# Patient Record
Sex: Female | Born: 1975 | Race: Black or African American | Hispanic: No | Marital: Single | Smoking: Never smoker
Health system: Southern US, Community
[De-identification: ages and names within clinical notes are randomized; demographics above are authoritative.]

## PROBLEM LIST (undated history)

## (undated) DIAGNOSIS — G8929 Other chronic pain: Secondary | ICD-10-CM

## (undated) DIAGNOSIS — I619 Nontraumatic intracerebral hemorrhage, unspecified: Secondary | ICD-10-CM

## (undated) DIAGNOSIS — G5600 Carpal tunnel syndrome, unspecified upper limb: Secondary | ICD-10-CM

## (undated) DIAGNOSIS — R519 Headache, unspecified: Secondary | ICD-10-CM

## (undated) DIAGNOSIS — F329 Major depressive disorder, single episode, unspecified: Secondary | ICD-10-CM

## (undated) DIAGNOSIS — F32A Depression, unspecified: Secondary | ICD-10-CM

## (undated) DIAGNOSIS — M549 Dorsalgia, unspecified: Secondary | ICD-10-CM

## (undated) DIAGNOSIS — J45909 Unspecified asthma, uncomplicated: Secondary | ICD-10-CM

## (undated) DIAGNOSIS — E119 Type 2 diabetes mellitus without complications: Secondary | ICD-10-CM

## (undated) DIAGNOSIS — R51 Headache: Secondary | ICD-10-CM

## (undated) DIAGNOSIS — F1110 Opioid abuse, uncomplicated: Secondary | ICD-10-CM

## (undated) HISTORY — PX: TUBAL LIGATION: SHX77

## (undated) HISTORY — PX: TONSILLECTOMY: SUR1361

---

## 1998-07-14 ENCOUNTER — Encounter: Payer: Self-pay | Admitting: Emergency Medicine

## 1998-07-14 ENCOUNTER — Emergency Department (HOSPITAL_COMMUNITY): Admission: EM | Admit: 1998-07-14 | Discharge: 1998-07-14 | Payer: Self-pay | Admitting: Emergency Medicine

## 1999-02-07 ENCOUNTER — Emergency Department (HOSPITAL_COMMUNITY): Admission: EM | Admit: 1999-02-07 | Discharge: 1999-02-07 | Payer: Self-pay | Admitting: *Deleted

## 1999-04-13 ENCOUNTER — Encounter: Payer: Self-pay | Admitting: Emergency Medicine

## 1999-04-13 ENCOUNTER — Emergency Department (HOSPITAL_COMMUNITY): Admission: EM | Admit: 1999-04-13 | Discharge: 1999-04-13 | Payer: Self-pay | Admitting: Emergency Medicine

## 2000-01-15 ENCOUNTER — Other Ambulatory Visit: Admission: RE | Admit: 2000-01-15 | Discharge: 2000-01-15 | Payer: Self-pay | Admitting: Internal Medicine

## 2002-02-10 ENCOUNTER — Emergency Department (HOSPITAL_COMMUNITY): Admission: EM | Admit: 2002-02-10 | Discharge: 2002-02-10 | Payer: Self-pay | Admitting: *Deleted

## 2002-03-29 ENCOUNTER — Emergency Department (HOSPITAL_COMMUNITY): Admission: EM | Admit: 2002-03-29 | Discharge: 2002-03-29 | Payer: Self-pay | Admitting: Emergency Medicine

## 2002-05-06 ENCOUNTER — Emergency Department (HOSPITAL_COMMUNITY): Admission: EM | Admit: 2002-05-06 | Discharge: 2002-05-06 | Payer: Self-pay | Admitting: Emergency Medicine

## 2002-06-16 ENCOUNTER — Emergency Department (HOSPITAL_COMMUNITY): Admission: EM | Admit: 2002-06-16 | Discharge: 2002-06-16 | Payer: Self-pay | Admitting: Emergency Medicine

## 2002-06-22 ENCOUNTER — Emergency Department (HOSPITAL_COMMUNITY): Admission: EM | Admit: 2002-06-22 | Discharge: 2002-06-22 | Payer: Self-pay | Admitting: Emergency Medicine

## 2002-09-18 ENCOUNTER — Encounter: Admission: RE | Admit: 2002-09-18 | Discharge: 2002-09-18 | Payer: Self-pay | Admitting: Sports Medicine

## 2002-12-24 ENCOUNTER — Emergency Department (HOSPITAL_COMMUNITY): Admission: EM | Admit: 2002-12-24 | Discharge: 2002-12-24 | Payer: Self-pay | Admitting: Emergency Medicine

## 2002-12-27 ENCOUNTER — Emergency Department (HOSPITAL_COMMUNITY): Admission: EM | Admit: 2002-12-27 | Discharge: 2002-12-27 | Payer: Self-pay | Admitting: Emergency Medicine

## 2003-02-08 ENCOUNTER — Other Ambulatory Visit: Admission: RE | Admit: 2003-02-08 | Discharge: 2003-02-08 | Payer: Self-pay | Admitting: *Deleted

## 2003-08-07 ENCOUNTER — Emergency Department (HOSPITAL_COMMUNITY): Admission: EM | Admit: 2003-08-07 | Discharge: 2003-08-07 | Payer: Self-pay

## 2003-11-06 ENCOUNTER — Emergency Department (HOSPITAL_COMMUNITY): Admission: EM | Admit: 2003-11-06 | Discharge: 2003-11-06 | Payer: Self-pay | Admitting: Emergency Medicine

## 2004-07-08 ENCOUNTER — Ambulatory Visit (HOSPITAL_COMMUNITY): Admission: RE | Admit: 2004-07-08 | Discharge: 2004-07-08 | Payer: Self-pay | Admitting: Family Medicine

## 2004-07-08 ENCOUNTER — Emergency Department (HOSPITAL_COMMUNITY): Admission: EM | Admit: 2004-07-08 | Discharge: 2004-07-08 | Payer: Self-pay | Admitting: Family Medicine

## 2005-09-24 ENCOUNTER — Emergency Department (HOSPITAL_COMMUNITY): Admission: EM | Admit: 2005-09-24 | Discharge: 2005-09-24 | Payer: Self-pay | Admitting: Family Medicine

## 2008-09-18 ENCOUNTER — Encounter (INDEPENDENT_AMBULATORY_CARE_PROVIDER_SITE_OTHER): Payer: Self-pay | Admitting: *Deleted

## 2008-10-25 ENCOUNTER — Ambulatory Visit: Payer: Self-pay | Admitting: Family Medicine

## 2008-10-25 DIAGNOSIS — F3289 Other specified depressive episodes: Secondary | ICD-10-CM | POA: Insufficient documentation

## 2008-10-25 DIAGNOSIS — G5600 Carpal tunnel syndrome, unspecified upper limb: Secondary | ICD-10-CM | POA: Insufficient documentation

## 2008-10-25 DIAGNOSIS — R51 Headache: Secondary | ICD-10-CM | POA: Insufficient documentation

## 2008-10-25 DIAGNOSIS — R930 Abnormal findings on diagnostic imaging of skull and head, not elsewhere classified: Secondary | ICD-10-CM | POA: Insufficient documentation

## 2008-10-25 DIAGNOSIS — R519 Headache, unspecified: Secondary | ICD-10-CM | POA: Insufficient documentation

## 2008-10-25 DIAGNOSIS — F329 Major depressive disorder, single episode, unspecified: Secondary | ICD-10-CM

## 2008-10-25 DIAGNOSIS — E669 Obesity, unspecified: Secondary | ICD-10-CM | POA: Insufficient documentation

## 2008-10-25 LAB — CONVERTED CEMR LAB
CO2: 23 meq/L (ref 19–32)
Calcium: 9.2 mg/dL (ref 8.4–10.5)
Creatinine, Ser: 0.8 mg/dL (ref 0.40–1.20)
GC Probe Amp, Urine: NEGATIVE
Hgb A1c MFr Bld: 5.8 %
TSH: 2.26 microintl units/mL (ref 0.350–4.500)

## 2008-10-28 ENCOUNTER — Encounter: Payer: Self-pay | Admitting: Family Medicine

## 2009-07-19 ENCOUNTER — Emergency Department (HOSPITAL_COMMUNITY): Admission: EM | Admit: 2009-07-19 | Discharge: 2009-07-20 | Payer: Self-pay | Admitting: Emergency Medicine

## 2010-01-04 ENCOUNTER — Emergency Department (HOSPITAL_COMMUNITY): Admission: EM | Admit: 2010-01-04 | Discharge: 2010-01-04 | Payer: Self-pay | Admitting: Emergency Medicine

## 2010-08-28 LAB — URINALYSIS, ROUTINE W REFLEX MICROSCOPIC
Bilirubin Urine: NEGATIVE
Ketones, ur: NEGATIVE mg/dL
Nitrite: NEGATIVE
Specific Gravity, Urine: 1.015 (ref 1.005–1.030)
Urobilinogen, UA: 0.2 mg/dL (ref 0.0–1.0)

## 2010-08-28 LAB — BASIC METABOLIC PANEL
BUN: 8 mg/dL (ref 6–23)
CO2: 25 mEq/L (ref 19–32)
Calcium: 9.2 mg/dL (ref 8.4–10.5)
Creatinine, Ser: 0.87 mg/dL (ref 0.4–1.2)
Glucose, Bld: 108 mg/dL — ABNORMAL HIGH (ref 70–99)

## 2010-08-28 LAB — DIFFERENTIAL
Basophils Absolute: 0.1 10*3/uL (ref 0.0–0.1)
Basophils Relative: 1 % (ref 0–1)
Monocytes Absolute: 0.5 10*3/uL (ref 0.1–1.0)
Neutro Abs: 6.6 10*3/uL (ref 1.7–7.7)
Neutrophils Relative %: 69 % (ref 43–77)

## 2010-08-28 LAB — CBC
MCHC: 33.8 g/dL (ref 30.0–36.0)
Platelets: 353 10*3/uL (ref 150–400)
RDW: 13.8 % (ref 11.5–15.5)

## 2010-08-28 LAB — RAPID URINE DRUG SCREEN, HOSP PERFORMED
Benzodiazepines: NOT DETECTED
Cocaine: POSITIVE — AB
Tetrahydrocannabinol: NOT DETECTED

## 2010-08-28 LAB — PREGNANCY, URINE: Preg Test, Ur: NEGATIVE

## 2012-02-03 ENCOUNTER — Encounter (HOSPITAL_COMMUNITY): Payer: Self-pay | Admitting: *Deleted

## 2012-02-03 ENCOUNTER — Emergency Department (HOSPITAL_COMMUNITY)
Admission: EM | Admit: 2012-02-03 | Discharge: 2012-02-04 | Disposition: A | Discharge status: Home / Self Care | Payer: Self-pay | Attending: Emergency Medicine | Admitting: Emergency Medicine

## 2012-02-03 DIAGNOSIS — R197 Diarrhea, unspecified: Secondary | ICD-10-CM | POA: Insufficient documentation

## 2012-02-03 DIAGNOSIS — N926 Irregular menstruation, unspecified: Secondary | ICD-10-CM

## 2012-02-03 DIAGNOSIS — R112 Nausea with vomiting, unspecified: Secondary | ICD-10-CM | POA: Insufficient documentation

## 2012-02-03 DIAGNOSIS — R109 Unspecified abdominal pain: Secondary | ICD-10-CM | POA: Insufficient documentation

## 2012-02-03 HISTORY — DX: Nontraumatic intracerebral hemorrhage, unspecified: I61.9

## 2012-02-03 HISTORY — DX: Opioid abuse, uncomplicated: F11.10

## 2012-02-03 HISTORY — DX: Unspecified asthma, uncomplicated: J45.909

## 2012-02-03 LAB — URINALYSIS, ROUTINE W REFLEX MICROSCOPIC
Ketones, ur: NEGATIVE mg/dL
Leukocytes, UA: NEGATIVE
Nitrite: NEGATIVE
pH: 6.5 (ref 5.0–8.0)

## 2012-02-03 LAB — URINE MICROSCOPIC-ADD ON

## 2012-02-03 LAB — BASIC METABOLIC PANEL
Chloride: 103 mEq/L (ref 96–112)
Creatinine, Ser: 0.66 mg/dL (ref 0.50–1.10)
GFR calc Af Amer: >90 mL/min (ref >90)
Potassium: 3.7 mEq/L (ref 3.5–5.1)
Sodium: 137 mEq/L (ref 135–145)

## 2012-02-03 LAB — HEPATIC FUNCTION PANEL
Albumin: 3.7 g/dL (ref 3.5–5.2)
Bilirubin, Direct: <0.1 mg/dL (ref 0.0–0.3)
Total Bilirubin: 0.2 mg/dL — ABNORMAL LOW (ref 0.3–1.2)

## 2012-02-03 LAB — WET PREP, GENITAL
Trich, Wet Prep: NONE SEEN
WBC, Wet Prep HPF POC: NONE SEEN

## 2012-02-03 MED ORDER — KETOROLAC TROMETHAMINE 15 MG/ML IJ SOLN
15.0000 mg | Freq: Once | INTRAMUSCULAR | Status: AC
  Administered 2012-02-03: 15 mg via INTRAVENOUS
  Filled 2012-02-03: qty 1

## 2012-02-03 MED ORDER — HYDROMORPHONE HCL PF 1 MG/ML IJ SOLN: 1.0000 mg | Freq: Once | INTRAMUSCULAR | Status: DC

## 2012-02-03 MED ORDER — SODIUM CHLORIDE 0.9 % IV BOLUS (SEPSIS)
1000.0000 mL | Freq: Once | INTRAVENOUS | Status: AC
  Administered 2012-02-03: 1000 mL via INTRAVENOUS

## 2012-02-03 MED ORDER — ONDANSETRON HCL 4 MG/2ML IJ SOLN: 4.0000 mg | Freq: Once | INTRAMUSCULAR | Status: DC

## 2012-02-03 MED ORDER — PROMETHAZINE HCL 25 MG/ML IJ SOLN
25.0000 mg | Freq: Once | INTRAMUSCULAR | Status: AC
  Administered 2012-02-03: 25 mg via INTRAVENOUS
  Filled 2012-02-03: qty 1

## 2012-02-03 MED ORDER — DIPHENHYDRAMINE HCL 50 MG/ML IJ SOLN
12.5000 mg | Freq: Once | INTRAMUSCULAR | Status: AC
  Administered 2012-02-03: 12.5 mg via INTRAVENOUS
  Filled 2012-02-03: qty 1

## 2012-02-03 NOTE — ED Notes (Signed)
Unable to obtain blood via IV - phlebotomy notified.

## 2012-02-03 NOTE — ED Provider Notes (Signed)
History     CSN: [id number]  Arrival date & time 02/03/12  2130   First MD Initiated Contact with Patient 02/03/12 2020      Chief Complaint  Patient presents with  . Abdominal Pain    (Consider location/radiation/quality/duration/timing/severity/associated sxs/prior treatment) HPIPatricia B Dalton is a 36 y.o. female who presents with acute onset of lower abdominal/suprapubic pain and that "feels like uterus contractions", is 10/10, nonradiating, sharp, constant, associated with nausea vomiting and diarrhea. Patient has had a numerable bouts of diarrhea this evening. There's been no blood in her stools or vomitus. She has a history of tubal ligation. She has a history of narcotic abuse-crack cocaine, she adamantly refuses any narcotic pain relievers she has been clean for 2 years. Food does not exacerbate this. She's had some chills. She denies any prior vaginal discharge, vaginal bleeding, vaginal pain. There are no other alleviating or exacerbating factors and no other associated symptoms. Patient is currently on her menstrual cycle and should "come off" tomorrow. She feels like she is bleeding more heavily than usual.  Past Medical History  Diagnosis Date  . Asthma   . Narcotic abuse   . Cerebral brain hemorrhage     Past Surgical History  Procedure Date  . Tubal ligation     History reviewed. No pertinent family history.  History  Substance Use Topics  . Smoking status: Not on file  . Smokeless tobacco: Not on file  . Alcohol Use:     OB History    Grav Para Term Preterm Abortions TAB SAB Ect Mult Living                  Review of Systems Patient denies any chills, changes in vision, earache, sore throat, neck pain or stiffness, chest pain or pressure, palpitations, syncope, dyspnea, cough, wheezing, melena, red bloody stools, frequency, dysuria, myalgias, arthralgias, back pain, recent trauma, rash, itching, skin lesions, easy bruising or bleeding, headache,  seizures, numbness, tingling or weakness and denies depression, and anxiety.    Allergies  Review of patient's allergies indicates no known allergies.  Home Medications   Current Outpatient Rx  Name Route Sig Dispense Refill  . IBUPROFEN 200 MG PO TABS Oral Take 400 mg by mouth every 8 (eight) hours as needed. For pain.      BP 119/69  Pulse 68  Temp 97.8 F (36.6 C) (Oral)  Resp 18  SpO2 95%  Physical Exam VITAL SIGNS:   Filed Vitals:   02/03/12 1941  BP: 119/69  Pulse: 68  Temp: 97.8 F (36.6 C)  Resp: 18   CONSTITUTIONAL: Awake, oriented, appears non-toxic HENT: Atraumatic, normocephalic, oral mucosa pink and moist, airway patent. Nares patent without drainage. External ears normal. EYES: Conjunctiva clear, EOMI, PERRLA NECK: Trachea midline, non-tender, supple CARDIOVASCULAR: Normal heart rate, Normal rhythm, No murmurs, rubs, gallops PULMONARY/CHEST: Clear to auscultation, no rhonchi, wheezes, or rales. Symmetrical breath sounds. Non-tender. ABDOMINAL: Obese, Non-distended, soft, non-tender - no rebound or guarding.  BS normal. NEUROLOGIC: Non-focal, moving all four extremities, no gross sensory or motor deficits. EXTREMITIES: No clubbing, cyanosis, or edema SKIN: Warm, Dry, No erythema, No rash Pelvic exam: normal external genitalia, some dark blood noted at the vulva, scant dark blood in the vault with some dark blood coming out the cervix, was normal cervix, uterus and adnexa. No cervical motion tenderness or adnexal tenderness.   ED Course  Procedures (including critical care time)  Labs Reviewed  BASIC METABOLIC PANEL - Abnormal; Notable for  the following:    Glucose, Bld 139 (*)     All other components within normal limits  URINALYSIS, ROUTINE W REFLEX MICROSCOPIC - Abnormal; Notable for the following:    APPearance CLOUDY (*)     Hgb urine dipstick MODERATE (*)     All other components within normal limits  HEPATIC FUNCTION PANEL - Abnormal;  Notable for the following:    Total Bilirubin 0.2 (*)     All other components within normal limits  URINE MICROSCOPIC-ADD ON - Abnormal; Notable for the following:    Bacteria, UA MANY (*)     All other components within normal limits  WET PREP, GENITAL - Abnormal; Notable for the following:    Clue Cells Wet Prep HPF POC FEW (*)     All other components within normal limits  PREGNANCY, URINE  GC/CHLAMYDIA PROBE AMP, GENITAL   No results found.   1. Abnormal menstrual cycle   2. Abdominal pain   3. Nausea & vomiting   4. Diarrhea       MDM  Nicole Dalton is a 36 y.o. female presenting with lower abdominal pain/pelvic pain, nausea vomiting and diarrhea with pain that she says is reminiscent of labor.  The overall picture certainly consistent with gastroenteritis however, despite inconsistencies with regard to dysuria frequency will check urinalysis, also check a urine pregnancy and some basic labs to rule out other acute life-threatening intra-abdominal emergencies.  Pt labs and workup normal.  Pt having some dysfunctional uterine bleeding in all likelihood - doubt STI, will not treat at this time but have sent DNA probe for GC/Chlamydia.  Few clue cells on Wet prep - doubt BV, likely confounder.  Pt with negative urine preg and dark blood/clots in the vault c/w menses.  N/V/D could be related to menses or AGE. Pt urged to f/u with PCP/OB.  Ibuprofen Rx. Doubt appendicitis with benign abdomen - repeat exam did not change. I explained the diagnosis in detail and have given ER return precautions including bright red bleeding, syncope or any other new or worsening symptoms. The patient understands and accepts the medical plan as it's been dictated and I have answered all questions. Discharge instructions concerning home care and prescriptions have been given.  The patient is STABLE and is discharged to home in good condition.         Jones Skene, MD 02/07/12 1941

## 2012-02-03 NOTE — ED Notes (Signed)
Assisted with pelvic  °

## 2012-02-03 NOTE — ED Notes (Signed)
Pt reports lower mid abd pain that began approx 1800 - pt describes pains as "labor pains" pt w/ n/v d/t pain on arrival to department. Pt grimacing and guarding on assessment.

## 2012-02-03 NOTE — ED Notes (Signed)
Per EMS - pt began having sharp pain below her umbilicus that began approx 1800 - pt admits to diarrhea x2 days - pt states "they feel like labor contractions" - pt w/ hx of tubal ligation. Pt denies n/v or fever.

## 2012-02-03 NOTE — ED Notes (Signed)
UEA:[id number]<[id number]> Expected date:<BR> Expected time:<BR> Means of arrival:<BR> Comments:<BR> menstral cramping

## 2012-02-04 ENCOUNTER — Other Ambulatory Visit (HOSPITAL_COMMUNITY): Payer: Self-pay | Admitting: Emergency Medicine

## 2012-02-04 ENCOUNTER — Emergency Department (HOSPITAL_COMMUNITY): Payer: Self-pay

## 2012-02-04 ENCOUNTER — Other Ambulatory Visit (HOSPITAL_COMMUNITY): Payer: Self-pay

## 2012-02-04 DIAGNOSIS — R109 Unspecified abdominal pain: Secondary | ICD-10-CM

## 2012-02-04 MED ORDER — IBUPROFEN 600 MG PO TABS: 600.0000 mg | ORAL_TABLET | Freq: Four times a day (QID) | ORAL | Status: AC | PRN

## 2012-02-04 MED ORDER — ACETAMINOPHEN 325 MG PO TABS
650.0000 mg | ORAL_TABLET | Freq: Once | ORAL | Status: AC
  Administered 2012-02-04: 650 mg via ORAL
  Filled 2012-02-04: qty 2

## 2012-02-04 MED ORDER — PROMETHAZINE HCL 25 MG PO TABS: 25.0000 mg | ORAL_TABLET | Freq: Four times a day (QID) | ORAL | Status: DC | PRN

## 2012-02-04 NOTE — ED Notes (Signed)
Rx given x2 Pt ambulating independently w/ steady gait on d/c in no acute distress, A&Ox4. D/c instructions reviewed w/ pt and family - pt and family deny any further questions or concerns at present.  

## 2012-02-04 NOTE — ED Provider Notes (Signed)
  Physical Exam  BP 108/54  Pulse 77  Temp 98.2 F (36.8 C) (Oral)  Resp 20  SpO2 97%  Physical Exam  ED Course  Procedures  MDM The patient states that she is unable to stay for her CT scan, she does agree to come back in the morning to have it done as an outpatient. The patient is aware that part of her differential diagnosis is appendicitis and that this could be a surgical problem. At this time the patient does appear stable however she does understand the gravity of her complaint and potential pathology. She will come back in the morning to have this CT scan done.      Vida Roller, MD 02/04/12 [id number]

## 2012-03-27 ENCOUNTER — Encounter (HOSPITAL_COMMUNITY): Payer: Self-pay | Admitting: Emergency Medicine

## 2012-03-27 ENCOUNTER — Emergency Department (HOSPITAL_COMMUNITY)
Admission: EM | Admit: 2012-03-27 | Discharge: 2012-03-27 | Disposition: A | Discharge status: Home / Self Care | Payer: Self-pay | Attending: Emergency Medicine | Admitting: Emergency Medicine

## 2012-03-27 DIAGNOSIS — S39012A Strain of muscle, fascia and tendon of lower back, initial encounter: Secondary | ICD-10-CM

## 2012-03-27 DIAGNOSIS — J45909 Unspecified asthma, uncomplicated: Secondary | ICD-10-CM | POA: Insufficient documentation

## 2012-03-27 DIAGNOSIS — X500XXA Overexertion from strenuous movement or load, initial encounter: Secondary | ICD-10-CM | POA: Insufficient documentation

## 2012-03-27 DIAGNOSIS — IMO0002 Reserved for concepts with insufficient information to code with codable children: Secondary | ICD-10-CM | POA: Insufficient documentation

## 2012-03-27 MED ORDER — IBUPROFEN 800 MG PO TABS: 800.0000 mg | ORAL_TABLET | Freq: Two times a day (BID) | ORAL | Status: DC

## 2012-03-27 MED ORDER — KETOROLAC TROMETHAMINE 60 MG/2ML IM SOLN
60.0000 mg | Freq: Once | INTRAMUSCULAR | Status: AC
  Administered 2012-03-27: 60 mg via INTRAMUSCULAR
  Filled 2012-03-27: qty 2

## 2012-03-27 NOTE — ED Provider Notes (Signed)
History     CSN: [id number]  Arrival date & time 03/27/12  5366   First MD Initiated Contact with Patient 03/27/12 1026      Chief Complaint  Patient presents with  . Back Pain    (Consider location/radiation/quality/duration/timing/severity/associated sxs/prior treatment) Patient is a 36 y.o. female presenting with back pain. The history is provided by the patient.  Back Pain  This is a new problem. The current episode started 1 to 2 hours ago. The problem occurs constantly. The problem has not changed since onset.The pain is associated with twisting. Pain location: left lumbar region. The pain does not radiate. The pain is at a severity of 5/10. The pain is moderate. The symptoms are aggravated by twisting, certain positions and bending. Pertinent negatives include no fever, no numbness, no weight loss, no headaches, no abdominal pain, no abdominal swelling, no bowel incontinence, no perianal numbness, no bladder incontinence, no dysuria, no pelvic pain, no leg pain, no paresthesias, no paresis, no tingling and no weakness. She has tried nothing (narcotic abuse hx, can not take narcotics, sober x 2 years) for the symptoms. Risk factors include lack of exercise and poor posture.    Past Medical History  Diagnosis Date  . Asthma   . Narcotic abuse   . Cerebral brain hemorrhage     Past Surgical History  Procedure Date  . Tubal ligation     No family history on file.  History  Substance Use Topics  . Smoking status: Never Smoker   . Smokeless tobacco: Not on file  . Alcohol Use: No    OB History    Grav Para Term Preterm Abortions TAB SAB Ect Mult Living                  Review of Systems  Constitutional: Negative for fever and weight loss.  Gastrointestinal: Negative for abdominal pain and bowel incontinence.  Genitourinary: Negative for bladder incontinence, dysuria and pelvic pain.  Musculoskeletal: Positive for back pain.  Neurological: Negative for tingling,  weakness, numbness, headaches and paresthesias.  All other systems reviewed and are negative.    Allergies  Review of patient's allergies indicates no known allergies.  Home Medications   Current Outpatient Rx  Name Route Sig Dispense Refill  . IBUPROFEN 200 MG PO TABS Oral Take 400 mg by mouth every 8 (eight) hours as needed. For pain.    Marland Kitchen PROMETHAZINE HCL 25 MG PO TABS Oral Take 1 tablet (25 mg total) by mouth every 6 (six) hours as needed for nausea. 30 tablet 0    BP 128/85  Pulse 66  Temp 97.8 F (36.6 C) (Oral)  Resp 20  SpO2 98%  LMP 03/13/2012  Physical Exam  Nursing note and vitals reviewed. Constitutional: She is oriented to person, place, and time. She appears well-developed and well-nourished. No distress.  HENT:  Head: Normocephalic and atraumatic.  Eyes: Conjunctivae normal and EOM are normal. Pupils are equal, round, and reactive to light. No scleral icterus.  Neck: Normal range of motion and full passive range of motion without pain. Neck supple. No spinous process tenderness and no muscular tenderness present. No rigidity. Normal range of motion present. No Brudzinski's sign noted.  Cardiovascular: Normal rate, regular rhythm and intact distal pulses.  Exam reveals no gallop and no friction rub.   No murmur heard. Pulmonary/Chest: Effort normal and breath sounds normal. No respiratory distress. She has no wheezes. She has no rales. She exhibits no tenderness.  Musculoskeletal:  Cervical back: She exhibits normal range of motion, no tenderness, no bony tenderness and no pain.       Thoracic back: She exhibits no tenderness, no bony tenderness and no pain.       Lumbar back: She exhibits tenderness and pain. She exhibits no bony tenderness, no spasm and normal pulse.       Right foot: She exhibits no swelling.       Left foot: She exhibits no swelling.       Bilateral lower extremities nontender without color change, baseline range of motion of extremities  with intact distal pulses, capillary refill less than 2 seconds bilaterally.  Pt has increased pain w ROM of lumbar spine. Pain w ambulation rotation & flexion, no sign of ataxia.  Neurological: She is alert and oriented to person, place, and time. She has normal strength and normal reflexes. No sensory deficit. Gait normal.       Sensation at baseline for light touch in all 4 distal extremities, motor symmetric & bilateral 5/5. No hyperreflexia   Skin: Skin is warm and dry. No rash noted. She is not diaphoretic. No erythema. No pallor.  Psychiatric: She has a normal mood and affect.    ED Course  Procedures (including critical care time)  Labs Reviewed - No data to display No results found.   No diagnosis found.    MDM  Patient with back pain.  No neurological deficits and normal neuro exam.  Patient can walk but states is painful.  No loss of bowel or bladder control.  No concern for cauda equina.  No fever, night sweats, weight loss, h/o cancer, IVDU.  RICE protocol and pain medicine indicated and discussed with patient.          Jaci Carrel, [place] 03/27/12 1123

## 2012-03-27 NOTE — ED Provider Notes (Signed)
Medical screening examination/treatment/procedure(s) were performed by non-physician practitioner and as supervising physician I was immediately available for consultation/collaboration.   Gavin Pound. Oletta Lamas, MD 03/27/12 1208

## 2012-03-27 NOTE — ED Notes (Signed)
Pt reports, "I pulled my back this morning." Pt was in the bathroom and when she turned she felt pain in her low-mid back. Pain increased when she bent over to put her shoes on.

## 2012-09-15 ENCOUNTER — Encounter (HOSPITAL_COMMUNITY): Payer: Self-pay | Admitting: General Practice

## 2012-09-15 ENCOUNTER — Emergency Department (HOSPITAL_COMMUNITY): Payer: Self-pay

## 2012-09-15 ENCOUNTER — Emergency Department (HOSPITAL_COMMUNITY)
Admission: EM | Admit: 2012-09-15 | Discharge: 2012-09-15 | Disposition: A | Discharge status: Home / Self Care | Payer: Self-pay | Attending: Emergency Medicine | Admitting: Emergency Medicine

## 2012-09-15 DIAGNOSIS — M25569 Pain in unspecified knee: Secondary | ICD-10-CM | POA: Insufficient documentation

## 2012-09-15 DIAGNOSIS — Z8659 Personal history of other mental and behavioral disorders: Secondary | ICD-10-CM | POA: Insufficient documentation

## 2012-09-15 DIAGNOSIS — M25562 Pain in left knee: Secondary | ICD-10-CM

## 2012-09-15 DIAGNOSIS — F1110 Opioid abuse, uncomplicated: Secondary | ICD-10-CM | POA: Insufficient documentation

## 2012-09-15 DIAGNOSIS — Z8679 Personal history of other diseases of the circulatory system: Secondary | ICD-10-CM | POA: Insufficient documentation

## 2012-09-15 DIAGNOSIS — M545 Low back pain, unspecified: Secondary | ICD-10-CM | POA: Insufficient documentation

## 2012-09-15 DIAGNOSIS — Z87820 Personal history of traumatic brain injury: Secondary | ICD-10-CM | POA: Insufficient documentation

## 2012-09-15 DIAGNOSIS — Z8669 Personal history of other diseases of the nervous system and sense organs: Secondary | ICD-10-CM | POA: Insufficient documentation

## 2012-09-15 DIAGNOSIS — J45909 Unspecified asthma, uncomplicated: Secondary | ICD-10-CM | POA: Insufficient documentation

## 2012-09-15 HISTORY — DX: Other chronic pain: G89.29

## 2012-09-15 HISTORY — DX: Major depressive disorder, single episode, unspecified: F32.9

## 2012-09-15 HISTORY — DX: Headache, unspecified: R51.9

## 2012-09-15 HISTORY — DX: Carpal tunnel syndrome, unspecified upper limb: G56.00

## 2012-09-15 HISTORY — DX: Dorsalgia, unspecified: M54.9

## 2012-09-15 HISTORY — DX: Headache: R51

## 2012-09-15 HISTORY — DX: Depression, unspecified: F32.A

## 2012-09-15 MED ORDER — METHOCARBAMOL 500 MG PO TABS: 1000.0000 mg | ORAL_TABLET | Freq: Four times a day (QID) | ORAL | Status: DC | PRN

## 2012-09-15 MED ORDER — HYDROCODONE-ACETAMINOPHEN 5-325 MG PO TABS: ORAL_TABLET | ORAL | Status: DC

## 2012-09-15 MED ORDER — NAPROXEN 250 MG PO TABS: 250.0000 mg | ORAL_TABLET | Freq: Two times a day (BID) | ORAL | Status: DC

## 2012-09-15 MED ORDER — ACETAMINOPHEN 500 MG PO TABS
1000.0000 mg | ORAL_TABLET | Freq: Once | ORAL | Status: AC
  Administered 2012-09-15: 1000 mg via ORAL
  Filled 2012-09-15: qty 2

## 2012-09-15 MED ORDER — IBUPROFEN 200 MG PO TABS
400.0000 mg | ORAL_TABLET | Freq: Once | ORAL | Status: AC
  Administered 2012-09-15: 400 mg via ORAL
  Filled 2012-09-15: qty 2

## 2012-09-15 NOTE — Progress Notes (Signed)
Orthopedic Tech Progress Note Patient Details:  Nicole Dalton 1975-10-05 [id number]  Ortho Devices Type of Ortho Device: Knee Immobilizer;Crutches Ortho Device/Splint Interventions: Application   Cammer, Mickie Bail 09/15/2012, 9:11 AM

## 2012-09-15 NOTE — ED Notes (Signed)
Ortho paged and will see pt.

## 2012-09-15 NOTE — ED Provider Notes (Signed)
History     CSN: [id number]  Arrival date & time 09/15/12  0714   First MD Initiated Contact with Patient 09/15/12 0715      Chief Complaint  Patient presents with  . Knee Pain  . Back Pain    HPI Pt was seen at 0725.  Per pt, c/o gradual onset and persistence of constant acute flair of her chronic low back "pain" for the past several days.  Denies any change in her usual chronic pain pattern for the past several years.  Pain worsens with palpation of the area and body position changes. Denies incont/retention of bowel or bladder, no saddle anesthesia, no focal motor weakness, no tingling/numbness in extremities, no fevers, no injury, no abd pain. Pt also c/o gradual onset and persistence of constant anterior left knee "pain" for the past several days. States the pain worsens when she "bends over and then stands up and straightens" her left knee.  Denies injury, no swelling, no rash.    Past Medical History  Diagnosis Date  . Asthma   . Narcotic abuse   . Cerebral brain hemorrhage     as a child  . Depression   . Chronic headache   . Chronic back pain   . Carpal tunnel syndrome     Past Surgical History  Procedure Laterality Date  . Tubal ligation    . Cesarean section    . Tonsillectomy       History  Substance Use Topics  . Smoking status: Never Smoker   . Smokeless tobacco: Not on file  . Alcohol Use: No      Review of Systems ROS: Statement: All systems negative except as marked or noted in the HPI; Constitutional: Negative for fever and chills. ; ; Eyes: Negative for eye pain, redness and discharge. ; ; ENMT: Negative for ear pain, hoarseness, nasal congestion, sinus pressure and sore throat. ; ; Cardiovascular: Negative for chest pain, palpitations, diaphoresis, dyspnea and peripheral edema. ; ; Respiratory: Negative for cough, wheezing and stridor. ; ; Gastrointestinal: Negative for nausea, vomiting, diarrhea, abdominal pain, blood in stool, hematemesis,  jaundice and rectal bleeding. . ; ; Genitourinary: Negative for dysuria, flank pain and hematuria. ; ; Musculoskeletal: +LBP, left knee pain. Negative for neck pain. Negative for swelling and trauma.; ; Skin: Negative for pruritus, rash, abrasions, blisters, bruising and skin lesion.; ; Neuro: Negative for headache, lightheadedness and neck stiffness. Negative for weakness, altered level of consciousness , altered mental status, extremity weakness, paresthesias, involuntary movement, seizure and syncope.       Allergies  Review of patient's allergies indicates no known allergies.  Home Medications   Current Outpatient Rx  Name  Route  Sig  Dispense  Refill  . ibuprofen (ADVIL,MOTRIN) 200 MG tablet   Oral   Take 200-600 mg by mouth every 6 (six) hours as needed for pain, fever or headache. For pain.         . Naproxen Sodium (ALEVE PO)   Oral   Take 1-3 tablets by mouth 2 (two) times daily as needed (headache or pain).           BP 123/77  Pulse 67  Temp(Src) 97.8 F (36.6 C) (Oral)  Resp 14  SpO2 100%  Physical Exam 0730: Physical examination:  Nursing notes reviewed; Vital signs and O2 SAT reviewed;  Constitutional: Well developed, Well nourished, Well hydrated, In no acute distress; Head:  Normocephalic, atraumatic; Eyes: EOMI, PERRL, No scleral icterus; ENMT: Mouth and  pharynx normal, Mucous membranes moist; Neck: Supple, Full range of motion, No lymphadenopathy; Cardiovascular: Regular rate and rhythm, No murmur, rub, or gallop; Respiratory: Breath sounds clear & equal bilaterally, No rales, rhonchi, wheezes.  Speaking full sentences with ease, Normal respiratory effort/excursion; Chest: Nontender, Movement normal; Abdomen: Soft, Nontender, Nondistended, Normal bowel sounds; Genitourinary: No CVA tenderness; Spine:  No midline CS, TS, LS tenderness.  +TTP left lumbar paraspinal muscles;; Extremities: Pulses normal, +FROM left knee, including able to lift extended LLE off  stretcher, and extend left lower leg against resistance.  No ligamentous laxity.  No patellar or quad tendon step-offs.  NMS intact left foot, strong pedal pp. +plantarflexion of left foot w/calf squeeze.  No palpable gap left Achilles's tendon.  No proximal fibular head tenderness.  No edema, erythema, warmth, ecchymosis or deformity.  NT left hip/knee/ankle/foot.  Pelvis stable. No edema, No calf edema or asymmetry.; Neuro: AA&Ox3, Major CN grossly intact.  Speech clear. Climbs on and off stretcher easily by herself. Gait steady. Strength 5/5 equal bilat UE's and LE's, including great toe dorsiflexion.  DTR 2/4 equal bilat UE's and LE's.  No gross sensory deficits.  Neg straight leg raises bilat.; Skin: Color normal, Warm, Dry.   ED Course  Procedures    MDM  MDM Reviewed: previous chart, nursing note and vitals Interpretation: x-ray    Dg Lumbar Spine Complete 09/15/2012  *RADIOLOGY REPORT*  Clinical Data: Low back pain.  LUMBAR SPINE - COMPLETE 4+ VIEW  Comparison:  07/08/2004.  Findings:  There is no evidence of lumbar spine fracture. Alignment is normal.  Intervertebral disc spaces are maintained.  IMPRESSION: Negative.   Original Report Authenticated By: Irish Lack, M.D.    Dg Knee Complete 4 Views Left 09/15/2012  *RADIOLOGY REPORT*  Clinical Data: Knee pain.  LEFT KNEE - COMPLETE 4+ VIEW  Comparison: 07/08/2004  Findings: Four views of the left knee were obtained.  Again noted are mild degenerative changes in the knee compartments.  No evidence for fracture or dislocation.  No significant joint effusion.  IMPRESSION: No acute abnormality.   Original Report Authenticated By: Richarda Overlie, M.D.     0830:  No acute findings on XR.  Will tx symptomatically at this time.  Strongly encouraged to f/u with Ortho MD and PMD for good continuity of care and control of her chronic pain. Dx and testing d/w pt. Questions answered.  Verb understanding, agreeable to d/c home with outpt  f/u.         Laray Anger, DO 09/17/12 1221

## 2012-09-15 NOTE — ED Notes (Addendum)
Per pt: pain started at work, new onset L knee pain along with ongoing back pain. 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 Pt reports pain on sides of patella, only on the anterior side of knee.

## 2012-11-19 ENCOUNTER — Encounter (HOSPITAL_COMMUNITY): Payer: Self-pay | Admitting: Emergency Medicine

## 2012-11-19 ENCOUNTER — Emergency Department (HOSPITAL_COMMUNITY)
Admission: EM | Admit: 2012-11-19 | Discharge: 2012-11-19 | Disposition: A | Discharge status: Home / Self Care | Payer: Self-pay | Attending: Emergency Medicine | Admitting: Emergency Medicine

## 2012-11-19 DIAGNOSIS — Z8679 Personal history of other diseases of the circulatory system: Secondary | ICD-10-CM | POA: Insufficient documentation

## 2012-11-19 DIAGNOSIS — M549 Dorsalgia, unspecified: Secondary | ICD-10-CM | POA: Insufficient documentation

## 2012-11-19 DIAGNOSIS — L918 Other hypertrophic disorders of the skin: Secondary | ICD-10-CM

## 2012-11-19 DIAGNOSIS — M79609 Pain in unspecified limb: Secondary | ICD-10-CM | POA: Insufficient documentation

## 2012-11-19 DIAGNOSIS — G8929 Other chronic pain: Secondary | ICD-10-CM | POA: Insufficient documentation

## 2012-11-19 DIAGNOSIS — J45909 Unspecified asthma, uncomplicated: Secondary | ICD-10-CM | POA: Insufficient documentation

## 2012-11-19 DIAGNOSIS — Z8659 Personal history of other mental and behavioral disorders: Secondary | ICD-10-CM | POA: Insufficient documentation

## 2012-11-19 DIAGNOSIS — Z8669 Personal history of other diseases of the nervous system and sense organs: Secondary | ICD-10-CM | POA: Insufficient documentation

## 2012-11-19 DIAGNOSIS — L909 Atrophic disorder of skin, unspecified: Secondary | ICD-10-CM | POA: Insufficient documentation

## 2012-11-19 NOTE — ED Provider Notes (Signed)
History     CSN: [id number]  Arrival date & time 11/19/12  4098   First MD Initiated Contact with Patient 11/19/12 0901      Chief Complaint  Patient presents with  . Recurrent Skin Infections    left leg    (Consider location/radiation/quality/duration/timing/severity/associated sxs/prior treatment) HPI Comments: Patient is a 37 year old female who presents today with one week of increasing pain in her left anterior upper thigh. She states she has had a "boil" for her entire life in that space, but it only recently became painful one week ago. It is a sharp pain without radiation. It hurts to the touch. When she walks her thigh rubs against it which makes the pain worse. She has not put any cream on it or taken anything for the pain. She denies fevers, chills, nausea, vomiting, abdominal pain, shortness of breath, chest pain.  The history is provided by the patient. No language interpreter was used.    Past Medical History  Diagnosis Date  . Asthma   . Narcotic abuse     former addict  . Cerebral brain hemorrhage     as a child  . Depression   . Chronic headache   . Chronic back pain   . Carpal tunnel syndrome     Past Surgical History  Procedure Laterality Date  . Tubal ligation    . Cesarean section    . Tonsillectomy      No family history on file.  History  Substance Use Topics  . Smoking status: Never Smoker   . Smokeless tobacco: Not on file  . Alcohol Use: No    OB History   Grav Para Term Preterm Abortions TAB SAB Ect Mult Living                  Review of Systems  Constitutional: Negative for fever and chills.  Respiratory: Negative for shortness of breath.   Gastrointestinal: Negative for nausea, vomiting and abdominal pain.  Genitourinary: Negative for dysuria, vaginal bleeding, vaginal discharge and difficulty urinating.  Skin: Positive for wound (irritated skin tag).  All other systems reviewed and are negative.    Allergies  Review of  patient's allergies indicates no known allergies.  Home Medications   Current Outpatient Rx  Name  Route  Sig  Dispense  Refill  . ibuprofen (ADVIL,MOTRIN) 200 MG tablet   Oral   Take 200-600 mg by mouth every 6 (six) hours as needed for pain, fever or headache. For pain.         . methocarbamol (ROBAXIN) 500 MG tablet   Oral   Take 2 tablets (1,000 mg total) by mouth 4 (four) times daily as needed (muscle spasm/pain).   25 tablet   0   . naproxen (NAPROSYN) 250 MG tablet   Oral   Take 1 tablet (250 mg total) by mouth 2 (two) times daily with a meal.   14 tablet   0   . Naproxen Sodium (ALEVE PO)   Oral   Take 1-3 tablets by mouth 2 (two) times daily as needed (headache or pain).           BP 126/84  Pulse 76  Temp(Src) 98.4 F (36.9 C) (Oral)  Resp 20  SpO2 98%  LMP 10/19/2012  Physical Exam  Nursing note and vitals reviewed. Constitutional: She is oriented to person, place, and time. She appears well-developed and well-nourished. No distress.  HENT:  Head: Normocephalic and atraumatic.  Right Ear: External  ear normal.  Left Ear: External ear normal.  Nose: Nose normal.  Mouth/Throat: Oropharynx is clear and moist.  Eyes: Conjunctivae are normal.  Neck: Normal range of motion.  Cardiovascular: Normal rate, regular rhythm and normal heart sounds.   Pulmonary/Chest: Effort normal and breath sounds normal. No stridor. No respiratory distress. She has no wheezes. She has no rales.  Abdominal: Soft. She exhibits no distension.  Musculoskeletal: Normal range of motion.  Neurological: She is alert and oriented to person, place, and time. She has normal strength.  Skin: Skin is warm and dry. She is not diaphoretic. No erythema.  Skin tag on left medial proximal thigh  Psychiatric: She has a normal mood and affect. Her behavior is normal.    ED Course  Debridement Performed by: Mora Bellman Authorized by: Mora Bellman Consent: Verbal consent  obtained. written consent not obtained. The procedure was performed in an emergent situation. Risks and benefits: risks, benefits and alternatives were discussed Consent given by: patient Patient understanding: patient states understanding of the procedure being performed Patient consent: the patient's understanding of the procedure matches consent given Required items: required blood products, implants, devices, and special equipment available Patient identity confirmed: verbally with patient and arm band Time out: Immediately prior to procedure a "time out" was called to verify the correct patient, procedure, equipment, support staff and site/side marked as required. Preparation: Patient was prepped and draped in the usual sterile fashion. Local anesthesia used: yes Anesthesia: local infiltration Local anesthetic: lidocaine 1% without epinephrine Anesthetic total: 2 ml Patient sedated: no Patient tolerance: Patient tolerated the procedure well with no immediate complications. Comments: Skin tag removed without complication   (including critical care time)  Labs Reviewed - No data to display No results found.   1. Skin tag       MDM  Patient presents with a skin tag that has almost come off on her left thigh. No signs of infection. Patient not diabetic. Removed skin tag without complication. Bacitracin and band-aid on area where skin tag was. Keep area clean with soap and water. Encouraged to establish care with pcp. Return instructions given. Vital signs stable for discharge. Patient / Family / Caregiver informed of clinical course, understand medical decision-making process, and agree with plan.         Mora Bellman, PA-C 11/20/12 [phone]

## 2012-11-19 NOTE — ED Notes (Signed)
Pt c/o boil on in the inside of left thigh x 1 week. Pt denies fever.

## 2012-11-21 NOTE — ED Provider Notes (Signed)
Medical screening examination/treatment/procedure(s) were performed by non-physician practitioner and as supervising physician I was immediately available for consultation/collaboration.   Glynn Octave, MD 11/21/12 1120

## 2013-01-08 ENCOUNTER — Encounter (HOSPITAL_COMMUNITY): Payer: Self-pay

## 2013-01-08 ENCOUNTER — Emergency Department (HOSPITAL_COMMUNITY)
Admission: EM | Admit: 2013-01-08 | Discharge: 2013-01-08 | Disposition: A | Discharge status: Home / Self Care | Payer: Self-pay | Attending: Emergency Medicine | Admitting: Emergency Medicine

## 2013-01-08 DIAGNOSIS — J069 Acute upper respiratory infection, unspecified: Secondary | ICD-10-CM | POA: Insufficient documentation

## 2013-01-08 DIAGNOSIS — Z8669 Personal history of other diseases of the nervous system and sense organs: Secondary | ICD-10-CM | POA: Insufficient documentation

## 2013-01-08 DIAGNOSIS — R51 Headache: Secondary | ICD-10-CM | POA: Insufficient documentation

## 2013-01-08 DIAGNOSIS — J3489 Other specified disorders of nose and nasal sinuses: Secondary | ICD-10-CM | POA: Insufficient documentation

## 2013-01-08 DIAGNOSIS — Z8659 Personal history of other mental and behavioral disorders: Secondary | ICD-10-CM | POA: Insufficient documentation

## 2013-01-08 DIAGNOSIS — Z79899 Other long term (current) drug therapy: Secondary | ICD-10-CM | POA: Insufficient documentation

## 2013-01-08 DIAGNOSIS — J310 Chronic rhinitis: Secondary | ICD-10-CM

## 2013-01-08 DIAGNOSIS — J45909 Unspecified asthma, uncomplicated: Secondary | ICD-10-CM | POA: Insufficient documentation

## 2013-01-08 DIAGNOSIS — R519 Headache, unspecified: Secondary | ICD-10-CM

## 2013-01-08 DIAGNOSIS — J029 Acute pharyngitis, unspecified: Secondary | ICD-10-CM | POA: Insufficient documentation

## 2013-01-08 DIAGNOSIS — Z8679 Personal history of other diseases of the circulatory system: Secondary | ICD-10-CM | POA: Insufficient documentation

## 2013-01-08 MED ORDER — PROMETHAZINE-DM 6.25-15 MG/5ML PO SYRP: 5.0000 mL | ORAL_SOLUTION | Freq: Four times a day (QID) | ORAL | Status: DC | PRN

## 2013-01-08 MED ORDER — PREDNISONE 50 MG PO TABS: 50.0000 mg | ORAL_TABLET | Freq: Every day | ORAL | Status: DC

## 2013-01-08 MED ORDER — KETOROLAC TROMETHAMINE 60 MG/2ML IM SOLN
60.0000 mg | Freq: Once | INTRAMUSCULAR | Status: AC
  Administered 2013-01-08: 60 mg via INTRAMUSCULAR
  Filled 2013-01-08: qty 2

## 2013-01-08 MED ORDER — GUAIFENESIN ER 1200 MG PO TB12: 1.0000 | ORAL_TABLET | Freq: Two times a day (BID) | ORAL | Status: DC

## 2013-01-08 NOTE — ED Provider Notes (Signed)
CSN: [id number]     Arrival date & time 01/08/13  0849 History     First MD Initiated Contact with Patient 01/08/13 0900     Chief Complaint  Patient presents with  . Headache  . Sinus Problem  . Sore Throat   (Consider location/radiation/quality/duration/timing/severity/associated sxs/prior Treatment) HPI Patient presents to the emergency department with frontal headache, nasal congestion, and drainage with mild sore throat for the last 24 hours.  Patient, states she tried Aleve at home with some relief of her headache.  The patient, states, that she is worried that she has a sinus infection patient has not had any other symptoms except for the last 24 hours patient, states she does not have any chest pain, cough, shortness of breath, nausea, vomiting, diarrhea, abdominal pain, back pain, neck pain, weakness, numbness, dizziness, or syncope.  Patient also denies fever. Past Medical History  Diagnosis Date  . Asthma   . Narcotic abuse     former addict  . Cerebral brain hemorrhage     as a child  . Depression   . Chronic headache   . Chronic back pain   . Carpal tunnel syndrome    Past Surgical History  Procedure Laterality Date  . Tubal ligation    . Cesarean section    . Tonsillectomy     No family history on file. History  Substance Use Topics  . Smoking status: Never Smoker   . Smokeless tobacco: Not on file  . Alcohol Use: No   OB History   Grav Para Term Preterm Abortions TAB SAB Ect Mult Living                 Review of Systems All other systems negative except as documented in the HPI. All pertinent positives and negatives as reviewed in the HPI. Allergies  Review of patient's allergies indicates no known allergies.  Home Medications   Current Outpatient Rx  Name  Route  Sig  Dispense  Refill  . methocarbamol (ROBAXIN) 500 MG tablet   Oral   Take 2 tablets (1,000 mg total) by mouth 4 (four) times daily as needed (muscle spasm/pain).   25 tablet   0    . naproxen (NAPROSYN) 250 MG tablet   Oral   Take 250 mg by mouth 2 (two) times daily as needed (Back and knee pain).         . naproxen sodium (ANAPROX) 220 MG tablet   Oral   Take 220 mg by mouth 2 (two) times daily as needed (Headache).         . phenylephrine (SUDAFED PE) 10 MG TABS   Oral   Take 10 mg by mouth 2 (two) times daily as needed (Sinus congestion).         . trolamine salicylate (ASPERCREME) 10 % cream   Topical   Apply 1 application topically at bedtime as needed (Back pain).          BP 134/85  Pulse 62  Temp(Src) 97.8 F (36.6 C) (Oral)  Resp 18  SpO2 100%  LMP 01/05/2013 Physical Exam  Constitutional: She is oriented to person, place, and time. She appears well-developed and well-nourished. No distress.  HENT:  Head: Normocephalic and atraumatic.  Right Ear: Tympanic membrane normal.  Left Ear: Tympanic membrane normal.  Nose: Mucosal edema present. No rhinorrhea. Right sinus exhibits maxillary sinus tenderness and frontal sinus tenderness. Left sinus exhibits maxillary sinus tenderness and frontal sinus tenderness.  Mouth/Throat: Uvula is  midline, oropharynx is clear and moist and mucous membranes are normal.  Eyes: Pupils are equal, round, and reactive to light.  Neck: Normal range of motion. Neck supple.  Cardiovascular: Normal rate, regular rhythm and normal heart sounds.   Pulmonary/Chest: Effort normal and breath sounds normal. No respiratory distress.  Neurological: She is alert and oriented to person, place, and time.  Skin: Skin is warm and dry.    ED Course   Procedures (including critical care time)  Patient will be treated for rhinitis and URI.  Patient is advised to return here as needed.  Told to increase her fluid intake, rest as much as possible.  MDM    Carlyle Dolly, PA-C 01/08/13 [phone]

## 2013-01-08 NOTE — ED Notes (Signed)
Pt reports sinus pressure, head pain and sore throat, since yesterday took sudafed and no relief noted.

## 2013-01-08 NOTE — ED Provider Notes (Signed)
Medical screening examination/treatment/procedure(s) were performed by non-physician practitioner and as supervising physician I was immediately available for consultation/collaboration.   Daveyon Kitchings, MD 01/08/13 1116 

## 2013-01-23 ENCOUNTER — Emergency Department (HOSPITAL_COMMUNITY): Payer: Self-pay

## 2013-01-23 ENCOUNTER — Emergency Department (HOSPITAL_COMMUNITY)
Admission: EM | Admit: 2013-01-23 | Discharge: 2013-01-23 | Disposition: A | Discharge status: Home / Self Care | Payer: Self-pay | Attending: Emergency Medicine | Admitting: Emergency Medicine

## 2013-01-23 ENCOUNTER — Encounter (HOSPITAL_COMMUNITY): Payer: Self-pay | Admitting: Emergency Medicine

## 2013-01-23 DIAGNOSIS — J020 Streptococcal pharyngitis: Secondary | ICD-10-CM | POA: Insufficient documentation

## 2013-01-23 DIAGNOSIS — G8929 Other chronic pain: Secondary | ICD-10-CM | POA: Insufficient documentation

## 2013-01-23 DIAGNOSIS — F329 Major depressive disorder, single episode, unspecified: Secondary | ICD-10-CM | POA: Insufficient documentation

## 2013-01-23 DIAGNOSIS — J45909 Unspecified asthma, uncomplicated: Secondary | ICD-10-CM | POA: Insufficient documentation

## 2013-01-23 DIAGNOSIS — R0989 Other specified symptoms and signs involving the circulatory and respiratory systems: Secondary | ICD-10-CM | POA: Insufficient documentation

## 2013-01-23 DIAGNOSIS — M549 Dorsalgia, unspecified: Secondary | ICD-10-CM | POA: Insufficient documentation

## 2013-01-23 DIAGNOSIS — F3289 Other specified depressive episodes: Secondary | ICD-10-CM | POA: Insufficient documentation

## 2013-01-23 MED ORDER — ALBUTEROL SULFATE (5 MG/ML) 0.5% IN NEBU
5.0000 mg | INHALATION_SOLUTION | Freq: Once | RESPIRATORY_TRACT | Status: AC
  Administered 2013-01-23: 5 mg via RESPIRATORY_TRACT
  Filled 2013-01-23: qty 1

## 2013-01-23 MED ORDER — PENICILLIN G BENZATHINE 1200000 UNIT/2ML IM SUSP
1.2000 10*6.[IU] | Freq: Once | INTRAMUSCULAR | Status: AC
  Administered 2013-01-23: 1.2 10*6.[IU] via INTRAMUSCULAR
  Filled 2013-01-23: qty 2

## 2013-01-23 MED ORDER — IPRATROPIUM BROMIDE 0.02 % IN SOLN
0.5000 mg | Freq: Once | RESPIRATORY_TRACT | Status: AC
  Administered 2013-01-23: 0.5 mg via RESPIRATORY_TRACT
  Filled 2013-01-23: qty 2.5

## 2013-01-23 NOTE — ED Provider Notes (Signed)
CSN: [id number]     Arrival date & time 01/23/13  1416 History     First MD Initiated Contact with Patient 01/23/13 1439     Chief Complaint  Patient presents with  . Sore Throat   (Consider location/radiation/quality/duration/timing/severity/associated sxs/prior Treatment) HPI Comments: Patient presents emergency department with chief complaint of sore throat. States that she has had a sore throat times one week. She was seen here approximately 2 weeks ago and was treated for bronchitis. She states that her symptoms have been persistent. She denies fevers, chills, nausea, vomiting. Nothing makes symptoms better or worse. She denies any difficulty breathing or swallowing.  The history is provided by the patient. No language interpreter was used.    Past Medical History  Diagnosis Date  . Asthma   . Narcotic abuse     former addict  . Cerebral brain hemorrhage     as a child  . Depression   . Chronic headache   . Chronic back pain   . Carpal tunnel syndrome    Past Surgical History  Procedure Laterality Date  . Tubal ligation    . Cesarean section    . Tonsillectomy     No family history on file. History  Substance Use Topics  . Smoking status: Never Smoker   . Smokeless tobacco: Not on file  . Alcohol Use: No   OB History   Grav Para Term Preterm Abortions TAB SAB Ect Mult Living                 Review of Systems  All other systems reviewed and are negative.    Allergies  Review of patient's allergies indicates no known allergies.  Home Medications   Current Outpatient Rx  Name  Route  Sig  Dispense  Refill  . Guaifenesin 1200 MG TB12   Oral   Take 1 tablet (1,200 mg total) by mouth 2 (two) times daily.   20 each   0   . naproxen (NAPROSYN) 250 MG tablet   Oral   Take 250 mg by mouth 2 (two) times daily as needed (Back and knee pain).         . naproxen sodium (ANAPROX) 220 MG tablet   Oral   Take 220 mg by mouth 2 (two) times daily as needed  (Headache).         . phenylephrine (SUDAFED PE) 10 MG TABS   Oral   Take 10 mg by mouth 2 (two) times daily as needed (Sinus congestion).         . predniSONE (DELTASONE) 50 MG tablet   Oral   Take 1 tablet (50 mg total) by mouth daily. In the a.m.   7 tablet   0   . trolamine salicylate (ASPERCREME) 10 % cream   Topical   Apply 1 application topically at bedtime as needed (Back pain).          BP 134/75  Pulse 76  Temp(Src) 98.6 F (37 C) (Oral)  Resp 16  SpO2 96%  LMP 01/18/2013 Physical Exam  Nursing note and vitals reviewed. Constitutional: She is oriented to person, place, and time. She appears well-developed and well-nourished.  HENT:  Head: Normocephalic and atraumatic.  Right Ear: External ear normal.  Left Ear: External ear normal.  Nose: Nose normal.  Mouth/Throat: Oropharynx is clear and moist. No oropharyngeal exudate.  Poor dentition throughout.    No signs of peritonsillar or tonsillar abscess.  No signs of gingival abscess.  Oropharynx is clear and without exudates.  Uvula is midline.  Airway is intact. No signs of Ludwig's angina.   Eyes: Conjunctivae and EOM are normal. Pupils are equal, round, and reactive to light.  Neck: Normal range of motion. Neck supple.  Cardiovascular: Normal rate and regular rhythm.  Exam reveals no gallop and no friction rub.   No murmur heard. Pulmonary/Chest: Effort normal. No respiratory distress. She has no wheezes. She has rales. She exhibits no tenderness.  Right upper lobe remarkable for rales  Abdominal: Soft. Bowel sounds are normal. She exhibits no distension and no mass. There is no tenderness. There is no rebound and no guarding.  Musculoskeletal: Normal range of motion. She exhibits no edema and no tenderness.  Neurological: She is alert and oriented to person, place, and time.  Skin: Skin is warm and dry.  Psychiatric: She has a normal mood and affect. Her behavior is normal. Judgment and thought content  normal.    ED Course   Procedures (including critical care time)  Labs Reviewed  RAPID STREP SCREEN   No results found. No diagnosis found.  MDM  Patient with sore throat, will check rapid strep, and anticipate discharge to home. There is no airway obstruction, no evidence of abscess.  Will order CXR 2/2 physical exam findings.  Will give breathing treatment.  3:36 PM Patient signed out to [place], who will continue care.  Plan: F/u on cxr and rapid strep.  If both are negative, discharge to home with symptomatic treatment.    Roxy Horseman, PA-C 01/23/13 1537

## 2013-01-23 NOTE — ED Notes (Signed)
Pt was seen here 2 weeks ago and tx for "bronchitis". States she started having pain in the right side of her throat about a week ago. Right face and neck appear slightly swollen.

## 2013-01-24 NOTE — ED Provider Notes (Signed)
Medical screening examination/treatment/procedure(s) were performed by non-physician practitioner and as supervising physician I was immediately available for consultation/collaboration.  Dorian Duval R. Tenia Goh, MD 01/24/13 1531 

## 2013-04-23 ENCOUNTER — Emergency Department (HOSPITAL_COMMUNITY)
Admission: EM | Admit: 2013-04-23 | Discharge: 2013-04-23 | Disposition: A | Discharge status: Home / Self Care | Payer: Self-pay | Attending: Emergency Medicine | Admitting: Emergency Medicine

## 2013-04-23 ENCOUNTER — Encounter (HOSPITAL_COMMUNITY): Payer: Self-pay | Admitting: Emergency Medicine

## 2013-04-23 ENCOUNTER — Emergency Department (HOSPITAL_COMMUNITY): Payer: Self-pay

## 2013-04-23 DIAGNOSIS — Y9289 Other specified places as the place of occurrence of the external cause: Secondary | ICD-10-CM | POA: Insufficient documentation

## 2013-04-23 DIAGNOSIS — Y990 Civilian activity done for income or pay: Secondary | ICD-10-CM | POA: Insufficient documentation

## 2013-04-23 DIAGNOSIS — S63501A Unspecified sprain of right wrist, initial encounter: Secondary | ICD-10-CM

## 2013-04-23 DIAGNOSIS — Z8659 Personal history of other mental and behavioral disorders: Secondary | ICD-10-CM | POA: Insufficient documentation

## 2013-04-23 DIAGNOSIS — J45909 Unspecified asthma, uncomplicated: Secondary | ICD-10-CM | POA: Insufficient documentation

## 2013-04-23 DIAGNOSIS — X503XXA Overexertion from repetitive movements, initial encounter: Secondary | ICD-10-CM | POA: Insufficient documentation

## 2013-04-23 DIAGNOSIS — Z8669 Personal history of other diseases of the nervous system and sense organs: Secondary | ICD-10-CM | POA: Insufficient documentation

## 2013-04-23 DIAGNOSIS — Y93E9 Activity, other interior property and clothing maintenance: Secondary | ICD-10-CM | POA: Insufficient documentation

## 2013-04-23 DIAGNOSIS — S63509A Unspecified sprain of unspecified wrist, initial encounter: Secondary | ICD-10-CM | POA: Insufficient documentation

## 2013-04-23 DIAGNOSIS — Z8739 Personal history of other diseases of the musculoskeletal system and connective tissue: Secondary | ICD-10-CM | POA: Insufficient documentation

## 2013-04-23 MED ORDER — IBUPROFEN 400 MG PO TABS
400.0000 mg | ORAL_TABLET | Freq: Once | ORAL | Status: AC
  Administered 2013-04-23: 400 mg via ORAL
  Filled 2013-04-23: qty 1

## 2013-04-23 MED ORDER — TRAMADOL HCL 50 MG PO TABS: 50.0000 mg | ORAL_TABLET | Freq: Four times a day (QID) | ORAL | Status: AC | PRN

## 2013-04-23 MED ORDER — IBUPROFEN 400 MG PO TABS: 400.0000 mg | ORAL_TABLET | Freq: Four times a day (QID) | ORAL | Status: AC | PRN

## 2013-04-23 NOTE — ED Notes (Signed)
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

## 2013-04-23 NOTE — ED Provider Notes (Signed)
CSN: [id number]     Arrival date & time 04/23/13  7829 History   First MD Initiated Contact with Patient 04/23/13 0601     Chief Complaint  Patient presents with  . Wrist Pain   (Consider location/radiation/quality/duration/timing/severity/associated sxs/prior Treatment) HPI This patient is a right-hand-dominant woman in generally good health. She is a Advertising copywriter. She says she injured her right wrist lifting a mattress a week ago. She continues to have pain in the right wrist. Feels like she needs to be off of work. Her pain is aching. Nonradiating. She's been using a wrist brace this is a design help. She denies paresthesias and motor weakness. She denies any other history of trauma   Past Medical History  Diagnosis Date  . Asthma   . Narcotic abuse     former addict  . Cerebral brain hemorrhage     as a child  . Depression   . Chronic headache   . Chronic back pain   . Carpal tunnel syndrome    Past Surgical History  Procedure Laterality Date  . Tubal ligation    . Cesarean section    . Tonsillectomy     No family history on file. History  Substance Use Topics  . Smoking status: Never Smoker   . Smokeless tobacco: Not on file  . Alcohol Use: No   OB History   Grav Para Term Preterm Abortions TAB SAB Ect Mult Living                 Review of Systems 10 point review of systems obtained and is negative with the exception of symptoms noted above.   Allergies  Review of patient's allergies indicates no known allergies.  Home Medications   Current Outpatient Rx  Name  Route  Sig  Dispense  Refill  . acetaminophen (TYLENOL) 500 MG tablet   Oral   Take 1,000 mg by mouth every 6 (six) hours as needed for mild pain, fever or headache.          BP 142/86  Pulse 82  Temp(Src) 98.2 F (36.8 C) (Oral)  Resp 18  SpO2 98%  LMP 04/19/2013 Physical Exam Gen: well developed and well nourished appearing Head: NCAT Eyes: PERL, EOMI Nose: no epistaixis or  rhinorrhea Mouth/throat: mucosa is moist and pink Lungs: CTA B, no wheezing, rhonchi or rales CV: RRR, no murmur, good extremity pulses  Abd: soft, notender, nondistended Skin: warm and dry, no lesions Ext: right upper ext - normal to inspection, mild ttp over the wrist diffusely, no deformity, hand is nontender, no snuff box ttp, fingers are nontender, cap refill < 2s. FROM wrist and all hand and finger joints.  Neuro: CN ii-xii grossly intact, no focal deficits, normal speech, normal gait.  Psyche; normal affect,  calm and cooperative.   ED Course  Procedures (including critical care time) Labs Review Labs Reviewed - No data to display Imaging Review Dg Wrist Complete Right  04/23/2013   CLINICAL DATA:  Worsening right wrist pain.  EXAM: RIGHT WRIST - COMPLETE 3+ VIEW  COMPARISON:  Right hand radiographs performed 01/04/2010  FINDINGS: There is no evidence of fracture or dislocation. A small osseous fragment is again noted adjacent to the ulnar styloid. The carpal rows are intact, and demonstrate normal alignment. The joint spaces are preserved.  No significant soft tissue abnormalities are seen.  IMPRESSION: No evidence of fracture or dislocation.   Electronically Signed   By: Roanna Raider M.D.   On:  04/23/2013 05:23     MDM  Patient with wrist strain. Plan is for discharge and symptomatic management along with outpatient follow up.     Brandt Loosen, MD 04/23/13 [phone]

## 2013-08-25 ENCOUNTER — Encounter (HOSPITAL_COMMUNITY): Payer: Self-pay | Admitting: Emergency Medicine

## 2013-08-25 ENCOUNTER — Emergency Department (HOSPITAL_COMMUNITY)
Admission: EM | Admit: 2013-08-25 | Discharge: 2013-08-25 | Disposition: A | Discharge status: Home / Self Care | Payer: No Typology Code available for payment source | Attending: Emergency Medicine | Admitting: Emergency Medicine

## 2013-08-25 DIAGNOSIS — Z79899 Other long term (current) drug therapy: Secondary | ICD-10-CM | POA: Insufficient documentation

## 2013-08-25 DIAGNOSIS — J069 Acute upper respiratory infection, unspecified: Secondary | ICD-10-CM | POA: Insufficient documentation

## 2013-08-25 DIAGNOSIS — J45909 Unspecified asthma, uncomplicated: Secondary | ICD-10-CM | POA: Insufficient documentation

## 2013-08-25 DIAGNOSIS — G8929 Other chronic pain: Secondary | ICD-10-CM | POA: Insufficient documentation

## 2013-08-25 DIAGNOSIS — F329 Major depressive disorder, single episode, unspecified: Secondary | ICD-10-CM | POA: Insufficient documentation

## 2013-08-25 DIAGNOSIS — F3289 Other specified depressive episodes: Secondary | ICD-10-CM | POA: Insufficient documentation

## 2013-08-25 MED ORDER — PSEUDOEPHEDRINE HCL 60 MG PO TABS: 60.0000 mg | ORAL_TABLET | Freq: Four times a day (QID) | ORAL | Status: DC | PRN

## 2013-08-25 MED ORDER — CETIRIZINE HCL 10 MG PO TABS: 10.0000 mg | ORAL_TABLET | Freq: Every day | ORAL | Status: AC

## 2013-08-25 MED ORDER — CETIRIZINE HCL 10 MG PO TABS: 10.0000 mg | ORAL_TABLET | Freq: Every day | ORAL | Status: DC

## 2013-08-25 NOTE — ED Notes (Signed)
Pt c/o nasal congestion, sore throat, L eye drainage, and generalized body aches x 4 days.  Pain score 5/10.  Pt reports taking Mucinex w/o relief.

## 2013-08-25 NOTE — ED Notes (Signed)
Pt escorted to discharge window. Verbalized understanding discharge instructions. In no acute distress.   

## 2013-08-25 NOTE — Discharge Instructions (Signed)
Take medications as directed. DPlease return to the Emergency Department at any point if you feel your condition is worsening.    Upper Respiratory Infection, Adult An upper respiratory infection (URI) is also known as the common cold. It is often caused by a type of germ (virus). Colds are easily spread (contagious). You can pass it to others by kissing, coughing, sneezing, or drinking out of the same glass. Usually, you get better in 1 or 2 weeks.  HOME CARE   Only take medicine as told by your doctor.  Use a warm mist humidifier or breathe in steam from a hot shower.  Drink enough water and fluids to keep your pee (urine) clear or pale yellow.  Get plenty of rest.  Return to work when your temperature is back to normal or as told by your doctor. You may use a face mask and wash your hands to stop your cold from spreading. GET HELP RIGHT AWAY IF:   After the first few days, you feel you are getting worse.  You have questions about your medicine.  You have chills, shortness of breath, or brown or red spit (mucus).  You have yellow or brown snot (nasal discharge) or pain in the face, especially when you bend forward.  You have a fever, puffy (swollen) neck, pain when you swallow, or white spots in the back of your throat.  You have a bad headache, ear pain, sinus pain, or chest pain.  You have a high-pitched whistling sound when you breathe in and out (wheezing).  You have a lasting cough or cough up blood.  You have sore muscles or a stiff neck. MAKE SURE YOU:   Understand these instructions.  Will watch your condition.  Will get help right away if you are not doing well or get worse. Document Released: 11/11/2007 Document Revised: 08/17/2011 Document Reviewed: 09/29/2010 [facility] Patient Information 2014 [place], [place].

## 2013-08-25 NOTE — ED Provider Notes (Signed)
Medical screening examination/treatment/procedure(s) were performed by non-physician practitioner and as supervising physician I was immediately available for consultation/collaboration.    Kathalene Frames, MD 08/25/13 [phone]

## 2013-08-25 NOTE — ED Provider Notes (Signed)
CSN: [id number]     Arrival date & time 08/25/13  9924 History   First MD Initiated Contact with Patient 08/25/13 0920     Chief Complaint  Patient presents with  . Nasal Congestion  . Sore Throat     (Consider location/radiation/quality/duration/timing/severity/associated sxs/prior Treatment) HPI 38 yo female presents with nasal congestino, dry cough, sneezing, watery eyes, runny nose, and sore throat x 4 days. Patient admits to gradual worsening of sxs. Patient been taking dayquil and nyquil with mild relief. Denies pain with swallowing, fever/chills, CP, SOB, abdominal pain, N/V. Admits to frontal HA that started after all the aforementioned sxs. Denies smoking. PMH significant Cerebral brain hemorrhage as child. Patient with hx of asthma but denies using any current medication or inhalers. Admits to recent sick contacts with similar sxs.  Past Medical History  Diagnosis Date  . Asthma   . Narcotic abuse     former addict  . Cerebral brain hemorrhage     as a child  . Depression   . Chronic headache   . Chronic back pain   . Carpal tunnel syndrome    Past Surgical History  Procedure Laterality Date  . Tubal ligation    . Cesarean section    . Tonsillectomy     History reviewed. No pertinent family history. History  Substance Use Topics  . Smoking status: Never Smoker   . Smokeless tobacco: Not on file  . Alcohol Use: No   OB History   Grav Para Term Preterm Abortions TAB SAB Ect Mult Living                 Review of Systems  All other systems reviewed and are negative.      Allergies  Review of patient's allergies indicates no known allergies.  Home Medications   Current Outpatient Rx  Name  Route  Sig  Dispense  Refill  . acetaminophen (TYLENOL) 500 MG tablet   Oral   Take 1,000 mg by mouth every 6 (six) hours as needed for mild pain, fever or headache.         . cetirizine (ZYRTEC ALLERGY) 10 MG tablet   Oral   Take 1 tablet (10 mg total) by mouth  daily.   30 tablet   1   . ibuprofen (ADVIL,MOTRIN) 400 MG tablet   Oral   Take 1 tablet (400 mg total) by mouth every 6 (six) hours as needed.   30 tablet   0   . pseudoephedrine (SUDAFED) 60 MG tablet   Oral   Take 1 tablet (60 mg total) by mouth every 6 (six) hours as needed for congestion.   20 tablet   0   . traMADol (ULTRAM) 50 MG tablet   Oral   Take 1 tablet (50 mg total) by mouth every 6 (six) hours as needed.   15 tablet   0    BP 143/92  Pulse 75  Temp(Src) 98.2 F (36.8 C) (Oral)  Resp 16  SpO2 98%  LMP 08/09/2013 Physical Exam  Nursing note and vitals reviewed. Constitutional: She is oriented to person, place, and time. She appears well-developed and well-nourished. No distress.  HENT:  Head: Normocephalic and atraumatic.  Right Ear: Tympanic membrane and ear canal normal.  Left Ear: Tympanic membrane and ear canal normal.  Nose: Mucosal edema and rhinorrhea present. Right sinus exhibits no maxillary sinus tenderness and no frontal sinus tenderness. Left sinus exhibits no maxillary sinus tenderness and no frontal sinus tenderness.  Mouth/Throat: Uvula is midline, oropharynx is clear and moist and mucous membranes are normal. No oropharyngeal exudate, posterior oropharyngeal edema or posterior oropharyngeal erythema.  Eyes: Conjunctivae and EOM are normal. Pupils are equal, round, and reactive to light. Right eye exhibits discharge (clear watery ). Left eye exhibits discharge (clear watery). Right conjunctiva is not injected. Left conjunctiva is not injected. No scleral icterus.  Neck: Normal range of motion, full passive range of motion without pain and phonation normal. Neck supple. No JVD present. No spinous process tenderness and no muscular tenderness present. No rigidity. No tracheal deviation, no edema, no erythema and normal range of motion present.  Cardiovascular: Normal rate and regular rhythm.  Exam reveals no gallop and no friction rub.   No murmur  heard. Pulmonary/Chest: Effort normal. No stridor. No respiratory distress. She has no wheezes. She has no rhonchi. She has no rales.  Musculoskeletal: She exhibits no edema.  Lymphadenopathy:    She has no cervical adenopathy.  Neurological: She is alert and oriented to person, place, and time.  Skin: Skin is warm and dry. She is not diaphoretic.  Psychiatric: She has a normal mood and affect. Her behavior is normal.    ED Course  Procedures (including critical care time) Labs Review Labs Reviewed - No data to display Imaging Review No results found.   EKG Interpretation None      MDM   Final diagnoses:  URI (upper respiratory infection)    Patient afebrile in NAD. Non toxic appearing.  HA consistent with sinus HA, given patient hx and sxs.  Recent hx of sick contacts, suspect Viral URI. Patient advised to return if she develops and chest pain or shortness of breath or worsening HA. Patient urged to return if she feels her sxs are getting progressively worse. Patient confirms understanding. Given note for work today. Discharged in good condition.   Meds given in ED:  Medications - No data to display  Discharge Medication List as of 08/25/2013  9:39 AM          Sherrie George, PA-C 08/25/13 [phone]

## 2014-08-15 ENCOUNTER — Emergency Department (HOSPITAL_COMMUNITY)
Admission: EM | Admit: 2014-08-15 | Discharge: 2014-08-16 | Disposition: A | Discharge status: Home / Self Care | Payer: No Typology Code available for payment source | Attending: Emergency Medicine | Admitting: Emergency Medicine

## 2014-08-15 ENCOUNTER — Encounter (HOSPITAL_COMMUNITY): Payer: Self-pay

## 2014-08-15 DIAGNOSIS — G8929 Other chronic pain: Secondary | ICD-10-CM | POA: Insufficient documentation

## 2014-08-15 DIAGNOSIS — F329 Major depressive disorder, single episode, unspecified: Secondary | ICD-10-CM | POA: Insufficient documentation

## 2014-08-15 DIAGNOSIS — Z8679 Personal history of other diseases of the circulatory system: Secondary | ICD-10-CM | POA: Insufficient documentation

## 2014-08-15 DIAGNOSIS — Z79899 Other long term (current) drug therapy: Secondary | ICD-10-CM | POA: Insufficient documentation

## 2014-08-15 DIAGNOSIS — D259 Leiomyoma of uterus, unspecified: Secondary | ICD-10-CM | POA: Insufficient documentation

## 2014-08-15 DIAGNOSIS — R1030 Lower abdominal pain, unspecified: Secondary | ICD-10-CM

## 2014-08-15 DIAGNOSIS — Z8669 Personal history of other diseases of the nervous system and sense organs: Secondary | ICD-10-CM | POA: Insufficient documentation

## 2014-08-15 DIAGNOSIS — J45909 Unspecified asthma, uncomplicated: Secondary | ICD-10-CM | POA: Insufficient documentation

## 2014-08-15 LAB — LIPASE, BLOOD: Lipase: 29 U/L (ref 11–59)

## 2014-08-15 LAB — CBC WITH DIFFERENTIAL/PLATELET
Basophils Absolute: 0 10*3/uL (ref 0.0–0.1)
Basophils Relative: 1 % (ref 0–1)
EOS PCT: 3 % (ref 0–5)
Eosinophils Absolute: 0.2 10*3/uL (ref 0.0–0.7)
HCT: 42.5 % (ref 36.0–46.0)
Hemoglobin: 13.8 g/dL (ref 12.0–15.0)
LYMPHS ABS: 2 10*3/uL (ref 0.7–4.0)
Lymphocytes Relative: 30 % (ref 12–46)
MCH: 28.5 pg (ref 26.0–34.0)
MCHC: 32.5 g/dL (ref 30.0–36.0)
MCV: 87.6 fL (ref 78.0–100.0)
MONO ABS: 0.4 10*3/uL (ref 0.1–1.0)
MONOS PCT: 5 % (ref 3–12)
NEUTROS ABS: 4.1 10*3/uL (ref 1.7–7.7)
Neutrophils Relative %: 61 % (ref 43–77)
PLATELETS: 401 10*3/uL — AB (ref 150–400)
RBC: 4.85 MIL/uL (ref 3.87–5.11)
RDW: 13.8 % (ref 11.5–15.5)
WBC: 6.7 10*3/uL (ref 4.0–10.5)

## 2014-08-15 LAB — BASIC METABOLIC PANEL
ANION GAP: 9 (ref 5–15)
BUN: 15 mg/dL (ref 6–23)
CHLORIDE: 106 mmol/L (ref 96–112)
CO2: 21 mmol/L (ref 19–32)
Calcium: 8.9 mg/dL (ref 8.4–10.5)
Creatinine, Ser: 0.71 mg/dL (ref 0.50–1.10)
Glucose, Bld: 135 mg/dL — ABNORMAL HIGH (ref 70–99)
Potassium: 3.3 mmol/L — ABNORMAL LOW (ref 3.5–5.1)
SODIUM: 136 mmol/L (ref 135–145)

## 2014-08-15 LAB — AMYLASE: AMYLASE: 105 U/L (ref 0–105)

## 2014-08-15 NOTE — ED Notes (Signed)
Pt complains of lower abd pain for two days, she's currently on her menstrual cycle

## 2014-08-15 NOTE — ED Notes (Signed)
Patient reports she has lower abdominal pain described as cramping.  She is currently on her menstrual period.  She reports she has sharp pains "every time I'm on my cycle."  She has taken Aleve and ibuprofen today.

## 2014-08-16 ENCOUNTER — Emergency Department (HOSPITAL_COMMUNITY): Payer: No Typology Code available for payment source

## 2014-08-16 ENCOUNTER — Encounter (HOSPITAL_COMMUNITY): Payer: Self-pay

## 2014-08-16 LAB — URINALYSIS, ROUTINE W REFLEX MICROSCOPIC
BILIRUBIN URINE: NEGATIVE
GLUCOSE, UA: NEGATIVE mg/dL
KETONES UR: NEGATIVE mg/dL
LEUKOCYTES UA: NEGATIVE
NITRITE: NEGATIVE
PH: 5.5 (ref 5.0–8.0)
Protein, ur: NEGATIVE mg/dL
Specific Gravity, Urine: 1.022 (ref 1.005–1.030)
Urobilinogen, UA: 0.2 mg/dL (ref 0.0–1.0)

## 2014-08-16 LAB — URINE MICROSCOPIC-ADD ON

## 2014-08-16 MED ORDER — IOHEXOL 300 MG/ML  SOLN
100.0000 mL | Freq: Once | INTRAMUSCULAR | Status: AC | PRN
  Administered 2014-08-16: 100 mL via INTRAVENOUS

## 2014-08-16 MED ORDER — IOHEXOL 300 MG/ML  SOLN
50.0000 mL | Freq: Once | INTRAMUSCULAR | Status: AC | PRN
  Administered 2014-08-16: 50 mL via ORAL

## 2014-08-16 MED ORDER — IBUPROFEN 600 MG PO TABS: 600.0000 mg | ORAL_TABLET | Freq: Four times a day (QID) | ORAL | Status: AC | PRN

## 2014-08-16 NOTE — Discharge Instructions (Signed)
Uterine Fibroid A uterine fibroid is a growth (tumor) that occurs in your uterus. This type of tumor is not cancerous and does not spread out of the uterus. You can have one or many fibroids. Fibroids can vary in size, weight, and where they grow in the uterus. Some can become quite large. Most fibroids do not require medical treatment, but some can cause pain or heavy bleeding during and between periods. CAUSES  A fibroid is the result of a single uterine cell that keeps growing (unregulated), which is different than most cells in the human body. Most cells have a control mechanism that keeps them from reproducing without control.  SIGNS AND SYMPTOMS   Bleeding.  Pelvic pain and pressure.  Bladder problems due to the size of the fibroid.  Infertility and miscarriages depending on the size and location of the fibroid. DIAGNOSIS  Uterine fibroids are diagnosed through a physical exam. Your health care provider may feel the lumpy tumors during a pelvic exam. Ultrasonography may be done to get information regarding size, location, and number of tumors.  TREATMENT   Your health care provider may recommend watchful waiting. This involves getting the fibroid checked by your health care provider to see if it grows or shrinks.   Hormone treatment or an intrauterine device (IUD) may be prescribed.   Surgery may be needed to remove the fibroids (myomectomy) or the uterus (hysterectomy). This depends on your situation. When fibroids interfere with fertility and a woman wants to become pregnant, a health care provider may recommend having the fibroids removed.  HOME CARE INSTRUCTIONS  Home care depends on how you were treated. In general:   Keep all follow-up appointments with your health care provider.   Only take over-the-counter or prescription medicines as directed by your health care provider. If you were prescribed a hormone treatment, take the hormone medicines exactly as directed. Do not  take aspirin. It can cause bleeding.   Talk to your health care provider about taking iron pills.  If your periods are troublesome but not so heavy, lie down with your feet raised slightly above your heart. Place cold packs on your lower abdomen.   If your periods are heavy, write down the number of pads or tampons you use per month. Bring this information to your health care provider.   Include green vegetables in your diet.  SEEK IMMEDIATE MEDICAL CARE IF:  You have pelvic pain or cramps not controlled with medicines.   You have a sudden increase in pelvic pain.   You have an increase in bleeding between and during periods.   You have excessive periods and soak tampons or pads in a half hour or less.  You feel lightheaded or have fainting episodes. Document Released: 05/22/2000 Document Revised: 03/15/2013 Document Reviewed: 12/22/2012 Exit[employer] Patient Information 2015 [employer]. This information is not intended to replace advice given to you by your health care provider. Make sure you discuss any questions you have with your health care provider.  

## 2014-08-16 NOTE — ED Provider Notes (Signed)
CSN: [id number]     Arrival date & time 08/15/14  1928 History   First MD Initiated Contact with Patient 08/15/14 2221     Chief Complaint  Patient presents with  . Abdominal Pain     (Consider location/radiation/quality/duration/timing/severity/associated sxs/prior Treatment) HPI Barbara Ahart is a 39 year old female who presents to the ER complaining of lower abdominal pain. Patient states her pain as a cramping sensation, and she has been experiencing for the past 2-1/2-3 years. Patient states the pain consistently begins on the second day of her period, goes away then reappears on day 5 or 6 of her period, then goes away for the next preceding month. She states this pain has been consistent like this for the past several years and occurs consistently for 2 days during every menses. Patient denies new pain, heavy vaginal bleeding or discharge. Patient denies fever, chills, nausea, vomiting associated with the pain. Patient states she typically uses Aleve and ibuprofen, which provides mild relief, however she still experiences pain consistently every month. Patient states she does not have an OB/GYN.   Past Medical History  Diagnosis Date  . Asthma   . Narcotic abuse     former addict  . Cerebral brain hemorrhage     as a child  . Depression   . Chronic headache   . Chronic back pain   . Carpal tunnel syndrome    Past Surgical History  Procedure Laterality Date  . Tubal ligation    . Cesarean section    . Tonsillectomy     History reviewed. No pertinent family history. History  Substance Use Topics  . Smoking status: Never Smoker   . Smokeless tobacco: Not on file  . Alcohol Use: No   OB History    No data available     Review of Systems  Constitutional: Negative for fever.  HENT: Negative for trouble swallowing.   Eyes: Negative for visual disturbance.  Respiratory: Negative for shortness of breath.   Cardiovascular: Negative for chest pain.  Gastrointestinal:  Positive for abdominal pain. Negative for nausea and vomiting.  Genitourinary: Negative for dysuria.  Musculoskeletal: Negative for neck pain.  Skin: Negative for rash.  Neurological: Negative for dizziness, weakness and numbness.  Psychiatric/Behavioral: Negative.       Allergies  Review of patient's allergies indicates no known allergies.  Home Medications   Prior to Admission medications   Medication Sig Start Date End Date Taking? Authorizing Provider  ibuprofen (ADVIL,MOTRIN) 200 MG tablet Take 400 mg by mouth daily as needed for moderate pain (pain).   Yes Historical Provider, MD  ibuprofen (ADVIL,MOTRIN) 400 MG tablet Take 1 tablet (400 mg total) by mouth every 6 (six) hours as needed. 04/23/13  Yes Elyn Peers, MD  naproxen sodium (ANAPROX) 220 MG tablet Take 660 mg by mouth daily as needed (pain).   Yes Historical Provider, MD  cetirizine (ZYRTEC ALLERGY) 10 MG tablet Take 1 tablet (10 mg total) by mouth daily. Patient not taking: Reported on 08/15/2014 08/25/13   Maude Leriche, PA-C  ibuprofen (ADVIL,MOTRIN) 600 MG tablet Take 1 tablet (600 mg total) by mouth every 6 (six) hours as needed. 08/16/14   Dahlia Bailiff, PA-C  traMADol (ULTRAM) 50 MG tablet Take 1 tablet (50 mg total) by mouth every 6 (six) hours as needed. Patient not taking: Reported on 08/15/2014 04/23/13   Elyn Peers, MD   BP 130/73 mmHg  Pulse 65  Temp(Src) 98.2 F (36.8 C) (Oral)  Resp 18  SpO2 99%  LMP 08/15/2014 Physical Exam  Constitutional: She is oriented to person, place, and time. She appears well-developed and well-nourished. No distress.  HENT:  Head: Normocephalic and atraumatic.  Mouth/Throat: Oropharynx is clear and moist. No oropharyngeal exudate.  Eyes: Right eye exhibits no discharge. Left eye exhibits no discharge. No scleral icterus.  Neck: Normal range of motion.  Cardiovascular: Normal rate, regular rhythm and normal heart sounds.   No murmur heard. Pulmonary/Chest: Effort normal and  breath sounds normal. No respiratory distress.  Abdominal: Soft. Normal appearance and bowel sounds are normal. There is no tenderness. There is no rigidity, no guarding, no tenderness at McBurney's point and negative Murphy's sign.  Musculoskeletal: Normal range of motion. She exhibits no edema or tenderness.  Neurological: She is alert and oriented to person, place, and time. No cranial nerve deficit. Coordination normal.  Skin: Skin is warm and dry. No rash noted. She is not diaphoretic.  Psychiatric: She has a normal mood and affect.  Nursing note and vitals reviewed.   ED Course  Procedures (including critical care time) Labs Review Labs Reviewed  BASIC METABOLIC PANEL - Abnormal; Notable for the following:    Potassium 3.3 (*)    Glucose, Bld 135 (*)    All other components within normal limits  CBC WITH DIFFERENTIAL/PLATELET - Abnormal; Notable for the following:    Platelets 401 (*)    All other components within normal limits  URINALYSIS, ROUTINE W REFLEX MICROSCOPIC - Abnormal; Notable for the following:    APPearance TURBID (*)    Hgb urine dipstick LARGE (*)    All other components within normal limits  URINE MICROSCOPIC-ADD ON - Abnormal; Notable for the following:    Squamous Epithelial / LPF FEW (*)    Bacteria, UA FEW (*)    Crystals CA OXALATE CRYSTALS (*)    All other components within normal limits  AMYLASE  LIPASE, BLOOD  PREGNANCY, URINE    Imaging Review Ct Abdomen Pelvis W Contrast  08/16/2014   CLINICAL DATA:  Lower abdominal pain and cramping.  EXAM: CT ABDOMEN AND PELVIS WITH CONTRAST  TECHNIQUE: Multidetector CT imaging of the abdomen and pelvis was performed using the standard protocol following bolus administration of intravenous contrast.  CONTRAST:  170mL OMNIPAQUE IOHEXOL 300 MG/ML  SOLN  COMPARISON:  None.  FINDINGS: The included lung bases are clear.  Liver appears mildly enlarged measuring 21 cm in cranial caudal dimension. There is a 12 mm  hypodensity within segment 4 of the liver. There is abnormal vasculature in the porta hepatis. 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 There is no evidence of thrombus. The common bile duct is normal in caliber and surrounded by the portal venous branches.  The gallbladder is decompressed. Spleen is normal. The pancreas and adrenal glands are normal. Kidneys demonstrate symmetric enhancement without hydronephrosis, perinephric stranding, or localizing renal abnormality.  Stomach is physiologically distended. There are no dilated or thickened bowel loops. Moderate volume of colonic stool. The appendix is normal.  The abdominal aorta is normal in caliber. The IVC is normal. No retroperitoneal adenopathy. No free air, free fluid, or intra-abdominal fluid collection.  Within the pelvis the uterus is prominent in size. There is an anterior fundal fibroid measuring approximately 2.1 cm. The endometrium appears thickened measuring 2.2 cm.  Both ovaries appear normal in size. No pelvic free fluid. The urinary bladder is physiologically distended.  No acute or suspicious osseous abnormalities.  IMPRESSION: 1. Uterine fibroid. Apparent endometrial thickening of 2.2 cm. Further characterization recommended with pelvic ultrasound. This could be performed on a nonemergent basis. 2. Hepatomegaly. 12 mm hypodense lesion in the left hepatic lobe. In the absence of history of malignancy this is likely a benign cyst or hemangioma. 3. 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 There is question of portal vein duplication versus interruption. No portal vein thrombus. 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   Electronically Signed   By: Jeb Levering M.D.   On: 08/16/2014 02:15     EKG Interpretation None      MDM   Final diagnoses:  Lower abdominal pain  Uterine leiomyoma, unspecified location    Patient's signs and symptoms consistent with a uterine cramping associated with her menstrual cycle. Patient has not been evaluated for this before although she still describes it is been going on for 3 years. Patient is afebrile, non-tachycardic, not tachypneic, not hypoxic, well-appearing and in no acute distress. On exam there is no concern for acute abdomen or surgical abdomen. Patient has no remarkable tenderness on exam, and points to her suprapubic region as where they cramping sensations coming from. Patient does describe vaginal bleeding, however states she is currently going through her menses, and states that she is having bleeding which is consistent with her menses. No concern for ovarian torsion, PID or ectopic pregnancy.  She states she is not concerned about the bleeding itself. I the patient has had no evaluation for this in the past, will follow with CT abdomen pelvis.  CT abdomen pelvis with evidence of a uterine fibroid with apparent endometrial thickening of 2.2 cm. Recommend further characterization with pelvic ultrasound on an nonemergent basis. We'll refer patient [facility] Return precautions discussed, and encouraged patient to call or return to ER should she have any questions or concerns.  BP 130/73 mmHg  Pulse 65  Temp(Src) 98.2 F (36.8 C) (Oral)  Resp 18  SpO2 99%  LMP 08/15/2014  Signed,  Dahlia Bailiff, PA-C 2:23 AM  Patient discussed with Dr. Jola Schmidt, MD    Dahlia Bailiff, PA-C 08/16/14 0224  Dahlia Bailiff, PA-C 08/16/14 6770  Jola Schmidt, MD 08/16/14 [phone]

## 2014-08-16 NOTE — ED Notes (Signed)
Patient transported to CT 

## 2014-08-17 LAB — URINE CULTURE: Colony Count: 50000

## 2014-08-21 ENCOUNTER — Emergency Department (INDEPENDENT_AMBULATORY_CARE_PROVIDER_SITE_OTHER)
Admission: EM | Admit: 2014-08-21 | Discharge: 2014-08-21 | Disposition: A | Discharge status: Home / Self Care | Payer: Self-pay | Source: Home / Self Care | Attending: Family Medicine | Admitting: Family Medicine

## 2014-08-21 ENCOUNTER — Encounter (HOSPITAL_COMMUNITY): Payer: Self-pay | Admitting: Emergency Medicine

## 2014-08-21 DIAGNOSIS — K529 Noninfective gastroenteritis and colitis, unspecified: Secondary | ICD-10-CM

## 2014-08-21 LAB — POCT URINALYSIS DIP (DEVICE)
Bilirubin Urine: NEGATIVE
Glucose, UA: NEGATIVE mg/dL
Hgb urine dipstick: NEGATIVE
KETONES UR: NEGATIVE mg/dL
Leukocytes, UA: NEGATIVE
Nitrite: NEGATIVE
Protein, ur: NEGATIVE mg/dL
SPECIFIC GRAVITY, URINE: 1.025 (ref 1.005–1.030)
Urobilinogen, UA: 0.2 mg/dL (ref 0.0–1.0)
pH: 5.5 (ref 5.0–8.0)

## 2014-08-21 LAB — POCT PREGNANCY, URINE: Preg Test, Ur: NEGATIVE

## 2014-08-21 MED ORDER — ONDANSETRON HCL 4 MG PO TABS: 4.0000 mg | ORAL_TABLET | Freq: Four times a day (QID) | ORAL | Status: AC

## 2014-08-21 MED ORDER — ONDANSETRON 4 MG PO TBDP
4.0000 mg | ORAL_TABLET | Freq: Once | ORAL | Status: AC
  Administered 2014-08-21: 4 mg via ORAL

## 2014-08-21 MED ORDER — ONDANSETRON 4 MG PO TBDP
ORAL_TABLET | ORAL | Status: AC
  Filled 2014-08-21: qty 1

## 2014-08-21 NOTE — Discharge Instructions (Signed)
Clear liquid , bland diet tonight as tolerated, advance on wed as improved, use medicine as needed, return or see your doctor if any problems.

## 2014-08-21 NOTE — ED Notes (Signed)
C/o abdominal pain, had vomiting and diarrhea that started last night.  Diarrhea and vomiting have slowed.  Today has headache and dizziness.

## 2014-08-21 NOTE — ED Provider Notes (Signed)
CSN: [id number]     Arrival date & time 08/21/14  1720 History   First MD Initiated Contact with Patient 08/21/14 1908     Chief Complaint  Patient presents with  . Abdominal Pain   (Consider location/radiation/quality/duration/timing/severity/associated sxs/prior Treatment) Patient is a 39 y.o. female presenting with abdominal pain. The history is provided by the patient.  Abdominal Pain Pain location:  Generalized Pain quality: cramping   Pain radiates to:  Does not radiate Pain severity:  Mild Onset quality:  Sudden Duration:  12 days Progression:  Improving Chronicity:  New Associated symptoms: diarrhea, nausea and vomiting   Associated symptoms: no fever and no vaginal bleeding     Past Medical History  Diagnosis Date  . Asthma   . Narcotic abuse     former addict  . Cerebral brain hemorrhage     as a child  . Depression   . Chronic headache   . Chronic back pain   . Carpal tunnel syndrome    Past Surgical History  Procedure Laterality Date  . Tubal ligation    . Cesarean section    . Tonsillectomy     No family history on file. History  Substance Use Topics  . Smoking status: Never Smoker   . Smokeless tobacco: Not on file  . Alcohol Use: No   OB History    No data available     Review of Systems  Constitutional: Negative.  Negative for fever.  Gastrointestinal: Positive for nausea, vomiting, abdominal pain and diarrhea.  Genitourinary: Negative for vaginal bleeding.  Neurological: Positive for headaches.    Allergies  Review of patient's allergies indicates no known allergies.  Home Medications   Prior to Admission medications   Medication Sig Start Date End Date Taking? Authorizing Provider  Naproxen Sodium (ALEVE PO) Take by mouth.   Yes Historical Provider, MD  cetirizine (ZYRTEC ALLERGY) 10 MG tablet Take 1 tablet (10 mg total) by mouth daily. Patient not taking: Reported on 08/15/2014 08/25/13   Maude Leriche, PA-C  ibuprofen (ADVIL,MOTRIN)  200 MG tablet Take 400 mg by mouth daily as needed for moderate pain (pain).    Historical Provider, MD  ibuprofen (ADVIL,MOTRIN) 400 MG tablet Take 1 tablet (400 mg total) by mouth every 6 (six) hours as needed. 04/23/13   Elyn Peers, MD  ibuprofen (ADVIL,MOTRIN) 600 MG tablet Take 1 tablet (600 mg total) by mouth every 6 (six) hours as needed. 08/16/14   Dahlia Bailiff, PA-C  naproxen sodium (ANAPROX) 220 MG tablet Take 660 mg by mouth daily as needed (pain).    Historical Provider, MD  ondansetron (ZOFRAN) 4 MG tablet Take 1 tablet (4 mg total) by mouth every 6 (six) hours. Prn n/v. 08/21/14   Billy Fischer, MD  traMADol (ULTRAM) 50 MG tablet Take 1 tablet (50 mg total) by mouth every 6 (six) hours as needed. Patient not taking: Reported on 08/15/2014 04/23/13   Elyn Peers, MD   BP 129/90 mmHg  Pulse 62  Temp(Src) 99 F (37.2 C) (Oral)  Resp 18  SpO2 100%  LMP 08/15/2014 Physical Exam  Constitutional: She is oriented to person, place, and time. She appears well-developed and well-nourished. No distress.  HENT:  Mouth/Throat: Oropharynx is clear and moist.  Eyes: Pupils are equal, round, and reactive to light.  Neck: Normal range of motion. Neck supple.  Cardiovascular: Normal heart sounds and intact distal pulses.   Pulmonary/Chest: Effort normal and breath sounds normal.  Abdominal: Soft. Bowel sounds are  normal. She exhibits no distension and no mass. There is no tenderness. There is no rebound and no guarding.  Neurological: She is alert and oriented to person, place, and time.  Skin: Skin is warm and dry.  Nursing note and vitals reviewed.   ED Course  Procedures (including critical care time) Labs Review Labs Reviewed  POCT URINALYSIS DIP (DEVICE)  POCT PREGNANCY, URINE   U/a and upreg neg. Imaging Review No results found.   MDM   1. Gastroenteritis, acute        Billy Fischer, MD 08/21/14 1949

## 2016-02-16 IMAGING — CT CT ABD-PELV W/ CM
1 of 2 series · 14 of 32 positions shown, 18 images · IV contrast (100 ML OMNI 300)
Comparison: None.

CLINICAL DATA: Lower abdominal pain and cramping.

EXAM:
CT ABDOMEN AND PELVIS WITH CONTRAST
TECHNIQUE: Multidetector CT imaging of the abdomen and pelvis was performed
using the standard protocol following bolus administration of
intravenous contrast.
CONTRAST:  100mL OMNIPAQUE IOHEXOL 300 MG/ML  SOLN

[Series 2: abd/pel with · axial · 0.74mm/px · z∈[-272,+88]mm · 14 of 83 slices shown, 18 images]
[im 7/83  soft-tissue]
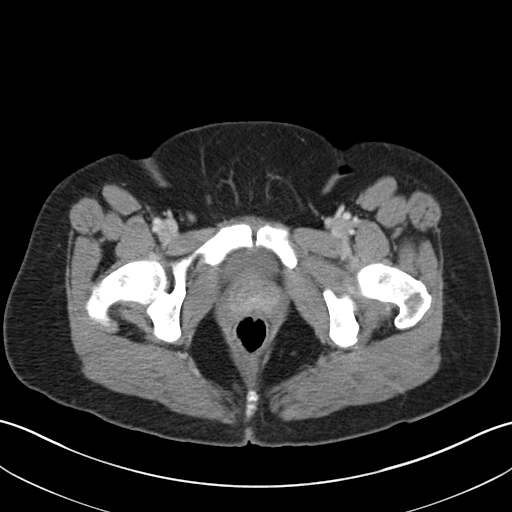
[im 7/83  bone]
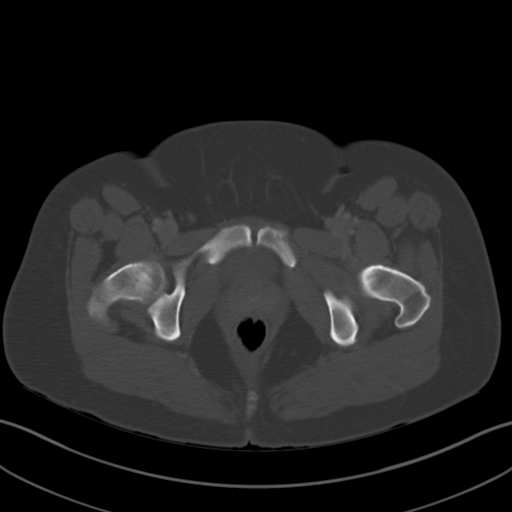
[im 13/83  soft-tissue]
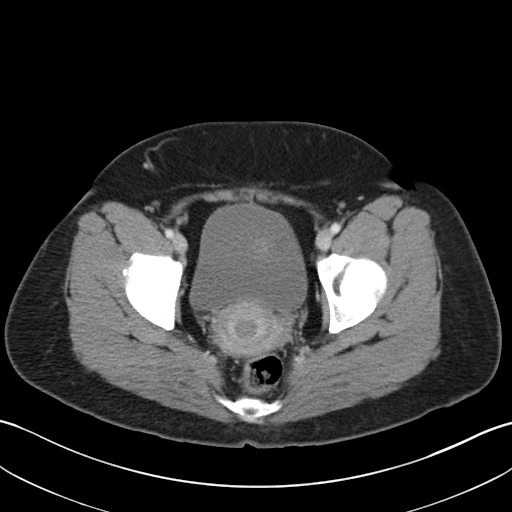
[im 19/83  soft-tissue]
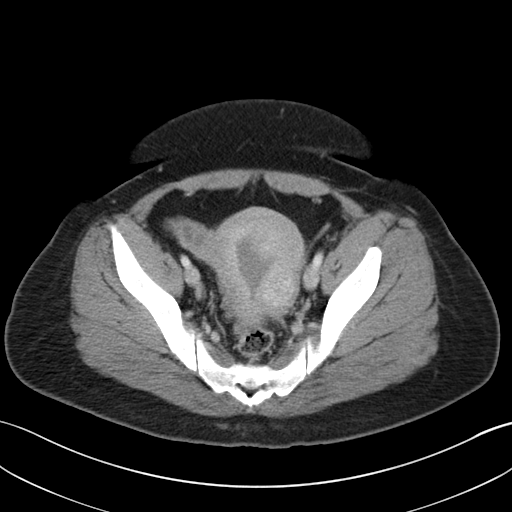
[im 25/83  soft-tissue]
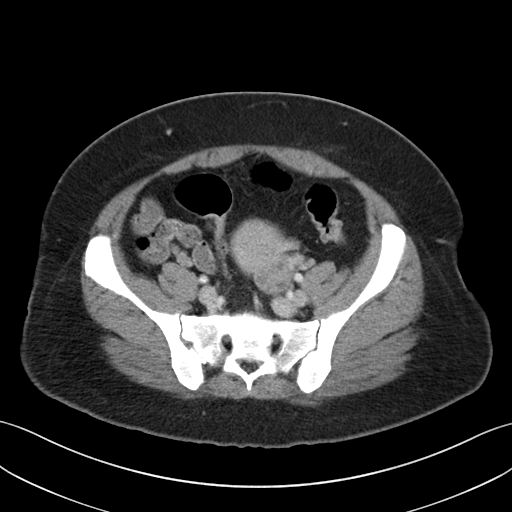
[im 31/83  soft-tissue]
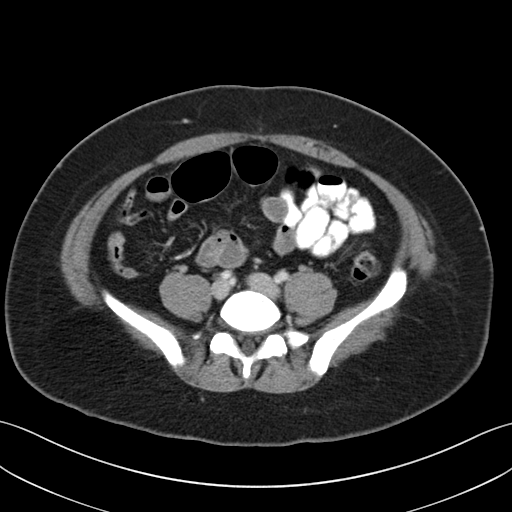
[im 37/83  soft-tissue]
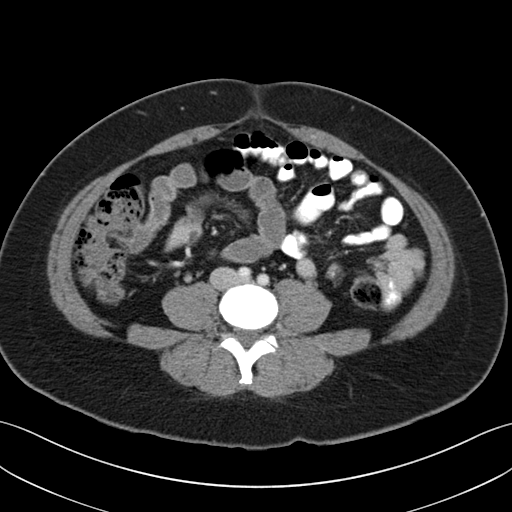
[im 46/83  soft-tissue]
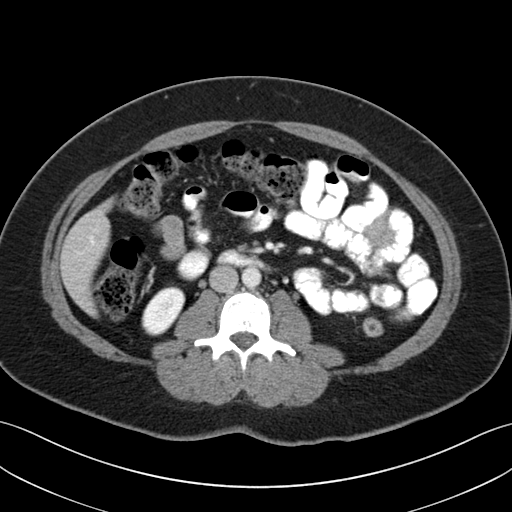
[im 52/83  soft-tissue]
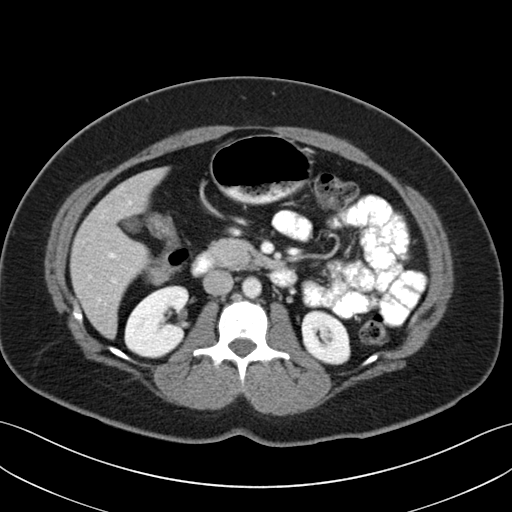
[im 58/83  soft-tissue]
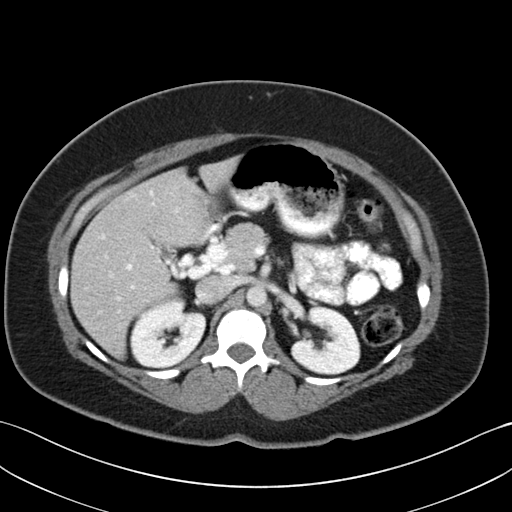
[im 58/83  bone]
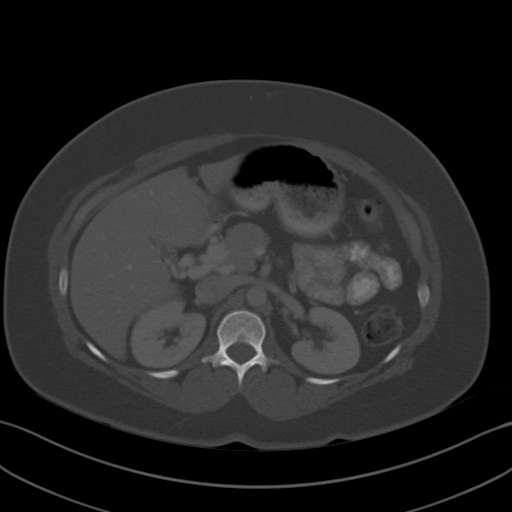
[im 64/83  soft-tissue]
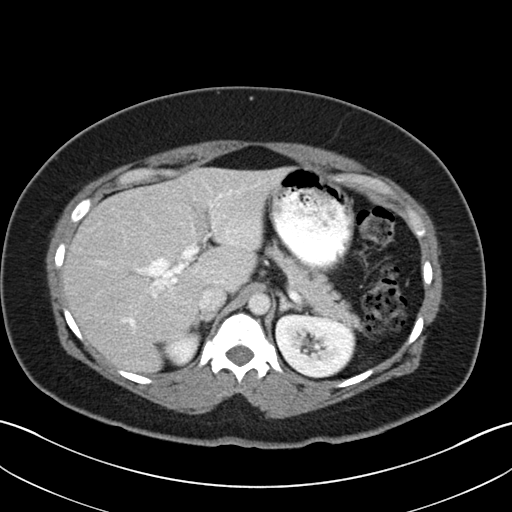
[im 70/83  soft-tissue]
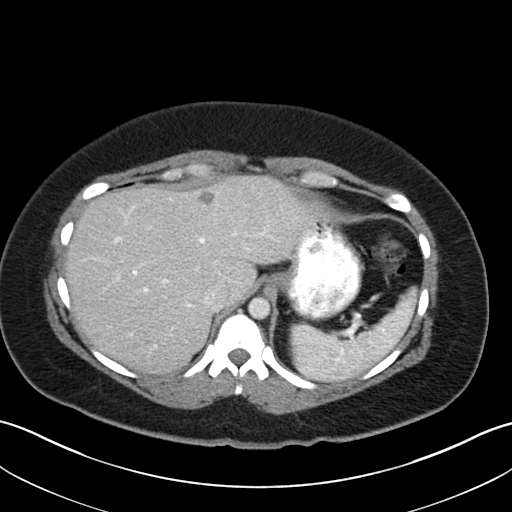
[im 70/83  lung]
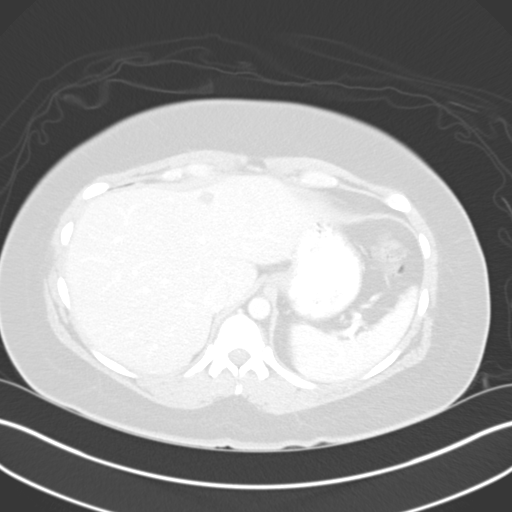
[im 73/83  lung]
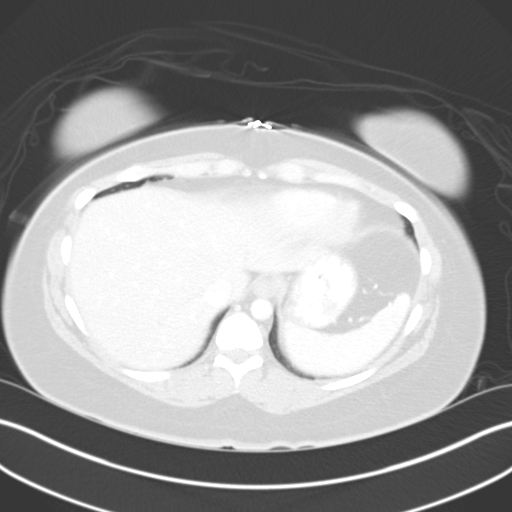
[im 76/83  soft-tissue]
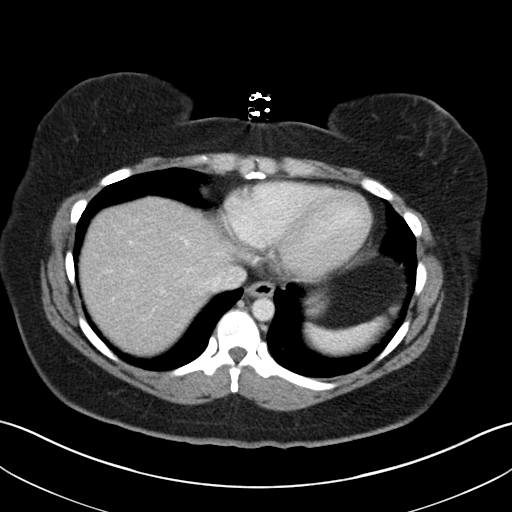
[im 76/83  lung]
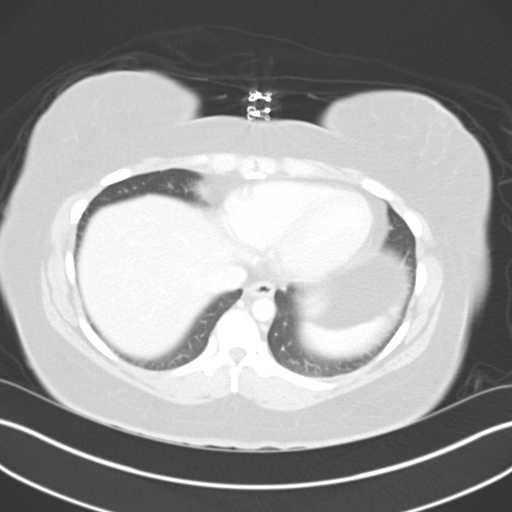
[im 79/83  lung]
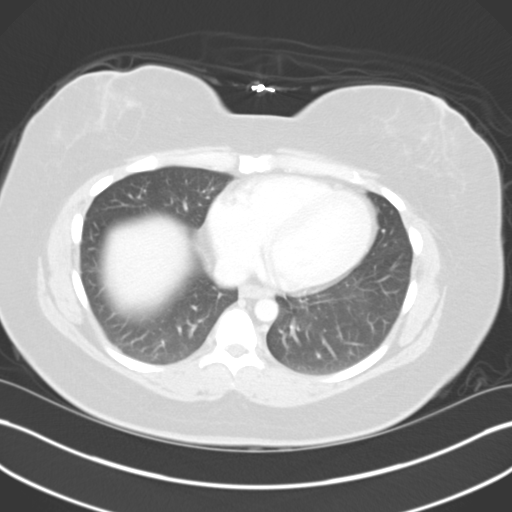

[14 of 32 positions shown; findings below may reference images not displayed]

FINDINGS: The included lung bases are clear.

Liver appears mildly enlarged measuring 21 cm in cranial caudal
dimension. There is a 12 mm hypodensity within segment 4 of the
liver. There is abnormal vasculature in the porta hepatis. There is
question of duplication of the portal vein with portal venous branch
joining with the superior mesenteric vein anteriorly, and a separate
portal venous branch shortening with the splenic vein posteriorly.
Multiple serpiginous portal vessels are seen in the porta hepatis.
There is no evidence of thrombus. The common bile duct is normal in
caliber and surrounded by the portal venous branches.

The gallbladder is decompressed. Spleen is normal. The pancreas and
adrenal glands are normal. Kidneys demonstrate symmetric enhancement
without hydronephrosis, perinephric stranding, or localizing renal
abnormality.

Stomach is physiologically distended. There are no dilated or
thickened bowel loops. Moderate volume of colonic stool. The
appendix is normal.

The abdominal aorta is normal in caliber. The IVC is normal. No
retroperitoneal adenopathy. No free air, free fluid, or
intra-abdominal fluid collection.

Within the pelvis the uterus is prominent in size. There is an
anterior fundal fibroid measuring approximately 2.1 cm. The
endometrium appears thickened measuring 2.2 cm. Both ovaries appear
normal in size. No pelvic free fluid. The urinary bladder is
physiologically distended.

No acute or suspicious osseous abnormalities.
IMPRESSION: 1. Uterine fibroid. Apparent endometrial thickening of 2.2 cm.
Further characterization recommended with pelvic ultrasound. This
could be performed on a nonemergent basis.
2. Hepatomegaly. 12 mm hypodense lesion in the left hepatic lobe. In
the absence of history of malignancy this is likely a benign cyst or
hemangioma.
3. Abnormal appearance of the portal vein in the porta hepatis with
multiple serpiginous vessels. There is question of portal vein
duplication versus interruption. No portal vein thrombus. Given the
absence of morphologic of suggest cirrhosis, this is likely
congenital or developmental in etiology.

## 2017-04-29 ENCOUNTER — Other Ambulatory Visit: Payer: Self-pay

## 2017-04-29 ENCOUNTER — Encounter (HOSPITAL_COMMUNITY): Payer: Self-pay | Admitting: *Deleted

## 2017-04-29 ENCOUNTER — Emergency Department (HOSPITAL_COMMUNITY)
Admission: EM | Admit: 2017-04-29 | Discharge: 2017-04-29 | Disposition: A | Discharge status: Home / Self Care | Payer: BLUE CROSS/BLUE SHIELD | Attending: Emergency Medicine | Admitting: Emergency Medicine

## 2017-04-29 DIAGNOSIS — J45909 Unspecified asthma, uncomplicated: Secondary | ICD-10-CM | POA: Diagnosis not present

## 2017-04-29 DIAGNOSIS — E119 Type 2 diabetes mellitus without complications: Secondary | ICD-10-CM | POA: Diagnosis not present

## 2017-04-29 DIAGNOSIS — L0231 Cutaneous abscess of buttock: Secondary | ICD-10-CM | POA: Diagnosis present

## 2017-04-29 DIAGNOSIS — Z79899 Other long term (current) drug therapy: Secondary | ICD-10-CM | POA: Insufficient documentation

## 2017-04-29 HISTORY — DX: Type 2 diabetes mellitus without complications: E11.9

## 2017-04-29 LAB — CBG MONITORING, ED: Glucose-Capillary: 240 mg/dL — ABNORMAL HIGH (ref 65–99)

## 2017-04-29 MED ORDER — CLINDAMYCIN HCL 150 MG PO CAPS: 300.0000 mg | ORAL_CAPSULE | Freq: Three times a day (TID) | ORAL | 0 refills | Status: AC

## 2017-04-29 MED ORDER — CLINDAMYCIN HCL 150 MG PO CAPS
300.0000 mg | ORAL_CAPSULE | Freq: Once | ORAL | Status: AC
  Administered 2017-04-29: 300 mg via ORAL
  Filled 2017-04-29: qty 2

## 2017-04-29 MED ORDER — LIDOCAINE-EPINEPHRINE (PF) 2 %-1:200000 IJ SOLN
10.0000 mL | Freq: Once | INTRAMUSCULAR | Status: AC
  Administered 2017-04-29: 10 mL via INTRADERMAL
  Filled 2017-04-29: qty 20

## 2017-04-29 MED ORDER — DOXYCYCLINE HYCLATE 100 MG PO CAPS: 100.0000 mg | ORAL_CAPSULE | Freq: Two times a day (BID) | ORAL | 0 refills | Status: AC

## 2017-04-29 NOTE — ED Provider Notes (Signed)
[facility] EMERGENCY DEPARTMENT Provider Note   CSN: [id number] Arrival date & time: 04/29/17  1531     History   Chief Complaint Chief Complaint  Patient presents with  . Abscess    HPI Nicole Dalton is a 41 y.o. female.  HPI Nicole Dalton is a 41 y.o. female with hx of diabetes, presents to ED with complaint of an abscess. Pt reports she noticed an abscess 5 days ago on the right buttock. States it is progressively became larger and more painful.  She states she has been taking warm baths and applying warm washcloth with no relief.  She denies any fever or chills.  No nausea or vomiting.  No drainage from the abscess.  No history of the same.  She states sitting and palpation of the area makes symptoms worse, nothing makes it better. No other complaints. States her blood sugars are usually under 200.  Past Medical History:  Diagnosis Date  . Asthma   . Carpal tunnel syndrome   . Cerebral brain hemorrhage ([place])    as a child  . Chronic back pain   . Chronic headache   . Depression   . Diabetes mellitus without complication ([place])   . Narcotic abuse [facility])    former addict    Patient Active Problem List   Diagnosis Date Noted  . OBESITY 10/25/2008  . DEPRESSIVE DISORDER 10/25/2008  . CARPAL TUNNEL SYNDROME, RIGHT 10/25/2008  . HEADACHE 10/25/2008  . Nonspecific (abnormal) findings on radiological and other examination of body structure 10/25/2008  . NONSPCIFC ABN FINDING RAD & OTH EXAM LUNG FIELD 10/25/2008    Past Surgical History:  Procedure Laterality Date  . CESAREAN SECTION    . TONSILLECTOMY    . TUBAL LIGATION      OB History    No data available       Home Medications    Prior to Admission medications   Medication Sig Start Date End Date Taking? Authorizing Provider  cetirizine (ZYRTEC ALLERGY) 10 MG tablet Take 1 tablet (10 mg total) by mouth daily. Patient not taking: Reported on 08/15/2014 08/25/13   Maude Leriche,  PA-C  ibuprofen (ADVIL,MOTRIN) 200 MG tablet Take 400 mg by mouth daily as needed for moderate pain (pain).    [provider]  ibuprofen (ADVIL,MOTRIN) 400 MG tablet Take 1 tablet (400 mg total) by mouth every 6 (six) hours as needed. 04/23/13   Elyn Peers, MD  ibuprofen (ADVIL,MOTRIN) 600 MG tablet Take 1 tablet (600 mg total) by mouth every 6 (six) hours as needed. 08/16/14   Dahlia Bailiff, PA-C  Naproxen Sodium (ALEVE PO) Take by mouth.    [provider]  naproxen sodium (ANAPROX) 220 MG tablet Take 660 mg by mouth daily as needed (pain).    [provider]  ondansetron (ZOFRAN) 4 MG tablet Take 1 tablet (4 mg total) by mouth every 6 (six) hours. Prn n/v. 08/21/14   Billy Fischer, MD  traMADol (ULTRAM) 50 MG tablet Take 1 tablet (50 mg total) by mouth every 6 (six) hours as needed. Patient not taking: Reported on 08/15/2014 04/23/13   Elyn Peers, MD    Family History No family history on file.  Social History Social History   Tobacco Use  . Smoking status: Never Smoker  Substance Use Topics  . Alcohol use: No  . Drug use: No    Comment: clean since 2011     Allergies   Patient has no  known allergies.   Review of Systems Review of Systems  Constitutional: Negative for chills and fever.  Respiratory: Negative for cough, chest tightness and shortness of breath.   Cardiovascular: Negative for chest pain, palpitations and leg swelling.  Gastrointestinal: Positive for rectal pain. Negative for abdominal pain, diarrhea, nausea and vomiting.  Musculoskeletal: Negative for arthralgias, myalgias, neck pain and neck stiffness.  Skin: Negative for rash.  Neurological: Negative for dizziness, weakness and headaches.  All other systems reviewed and are negative.    Physical Exam Updated Vital Signs BP (!) 146/85   Pulse 78   Temp 97.7 F (36.5 C) (Oral)   Resp 16   SpO2 100%   Physical Exam  Constitutional: She appears well-developed and  well-nourished. No distress.  HENT:  Head: Normocephalic.  Eyes: Conjunctivae are normal.  Neck: Neck supple.  Cardiovascular: Normal rate, regular rhythm and normal heart sounds.  Pulmonary/Chest: Effort normal and breath sounds normal. No respiratory distress. She has no wheezes. She has no rales.  Abdominal: Soft. Bowel sounds are normal. She exhibits no distension. There is no tenderness. There is no rebound.  Genitourinary:  Genitourinary Comments: Abscess to the left buttock, just peripheral to rectum. No drainage. Area is erythematous, fluctuant, ttp.   Musculoskeletal: She exhibits no edema.  Neurological: She is alert.  Skin: Skin is warm and dry.  Psychiatric: She has a normal mood and affect. Her behavior is normal.  Nursing note and vitals reviewed.    ED Treatments / Results  Labs (all labs ordered are listed, but only abnormal results are displayed) Labs Reviewed  CBG MONITORING, ED - Abnormal; Notable for the following components:      Result Value   Glucose-Capillary 240 (*)    All other components within normal limits    EKG  EKG Interpretation None       Radiology No results found.  Procedures .Marland KitchenIncision and Drainage Date/Time: 04/29/2017 5:46 PM Performed by: Jeannett Senior, PA-C Authorized by: Jeannett Senior, PA-C   Consent:    Consent obtained:  Verbal   Consent given by:  Patient   Risks discussed:  Bleeding, incomplete drainage, pain and infection   Alternatives discussed:  No treatment Location:    Type:  Abscess   Location:  Anogenital   Anogenital location:  Gluteal cleft Anesthesia (see MAR for exact dosages):    Anesthesia method:  Local infiltration   Local anesthetic:  Lidocaine 2% WITH epi Procedure type:    Complexity:  Simple Procedure details:    Incision types:  Single straight   Incision depth:  Dermal   Scalpel blade:  11   Wound management:  Probed and deloculated and irrigated with saline   Drainage:   Purulent   Drainage amount:  Copious   Packing materials:  1/4 in gauze Post-procedure details:    Patient tolerance of procedure:  Tolerated well, no immediate complications   (including critical care time)  Medications Ordered in ED Medications  lidocaine-EPINEPHrine (XYLOCAINE W/EPI) 2 %-1:200000 (PF) injection 10 mL (not administered)     Initial Impression / Assessment and Plan / ED Course  I have reviewed the triage vital signs and the nursing notes.  Pertinent labs & imaging results that were available during my care of the patient were reviewed by me and considered in my medical decision making (see chart for details).     Pt with a perirectal abscess with surrounding cellulitis.  No systemic symptoms.  Afebrile.  Nontoxic-appearing otherwise.  Incised and drained  in the emergency department with large amount of purulent drainage.  Packed.  Plan to discharge home with antibiotics.  Return in 2 days for recheck.  Patient's blood sugars 240 in the emergency department, patient states she just finished eating Thanksgiving dinner.    Vitals:   04/29/17 1537  BP: (!) 146/85  Pulse: 78  Resp: 16  Temp: 97.7 F (36.5 C)  TempSrc: Oral  SpO2: 100%     Final Clinical Impressions(s) / ED Diagnoses   Final diagnoses:  Abscess of buttock, right    ED Discharge Orders    None       Jeannett Senior, PA-C 04/29/17 1750    Quintella Reichert, MD 04/30/17 1949

## 2017-04-29 NOTE — ED Triage Notes (Signed)
To ED for treatment of abscess to right buttocks. Noticed on Sunday. No drainage.

## 2017-04-29 NOTE — Discharge Instructions (Signed)
Warm soaks or compresses several times a day. Take antibiotics as prescribed until all gone. Ibuprofen for pain. Follow up with family doctor or here in 2 days for recheck.

## 2017-05-01 ENCOUNTER — Other Ambulatory Visit: Payer: Self-pay

## 2017-05-01 ENCOUNTER — Encounter (HOSPITAL_COMMUNITY): Payer: Self-pay

## 2017-05-01 ENCOUNTER — Emergency Department (HOSPITAL_COMMUNITY)
Admission: EM | Admit: 2017-05-01 | Discharge: 2017-05-01 | Disposition: A | Discharge status: Home / Self Care | Payer: BLUE CROSS/BLUE SHIELD | Attending: Emergency Medicine | Admitting: Emergency Medicine

## 2017-05-01 DIAGNOSIS — Z4800 Encounter for change or removal of nonsurgical wound dressing: Secondary | ICD-10-CM | POA: Insufficient documentation

## 2017-05-01 DIAGNOSIS — Z5189 Encounter for other specified aftercare: Secondary | ICD-10-CM

## 2017-05-01 NOTE — Discharge Instructions (Signed)
Sit in warm tubs of water or apply warm wet compresses to the area. Continue the antibiotics. Return for any problems.

## 2017-05-01 NOTE — ED Triage Notes (Signed)
Patient here as scheduled to have abscess rechecked that was drained 2 days ago, states that she is taking antibiotics and feeling better, NAD

## 2017-05-01 NOTE — ED Provider Notes (Signed)
[facility] EMERGENCY DEPARTMENT Provider Note   CSN: [id number] Arrival date & time: 05/01/17  0539     History   Chief Complaint Chief Complaint  Patient presents with  . recheck abscess    HPI Nicole Dalton is a 41 y.o. female who presents to the ED for recheck of of abscess. Patient was evaluated 04/29/17 for abscess to right buttock. Patient reports she feels much better since the I&D.   HPI  Past Medical History:  Diagnosis Date  . Asthma   . Carpal tunnel syndrome   . Cerebral brain hemorrhage ([place])    as a child  . Chronic back pain   . Chronic headache   . Depression   . Diabetes mellitus without complication ([place])   . 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 OBESITY 10/25/2008  . DEPRESSIVE DISORDER 10/25/2008  . CARPAL TUNNEL SYNDROME, RIGHT 10/25/2008  . HEADACHE 10/25/2008  . Nonspecific (abnormal) findings on radiological and other examination of body structure 10/25/2008  . NONSPCIFC ABN FINDING RAD & OTH EXAM LUNG FIELD 10/25/2008    Past Surgical History:  Procedure Laterality Date  . CESAREAN SECTION    . TONSILLECTOMY    . TUBAL LIGATION      OB History    No data available       Home Medications    Prior to Admission medications   Medication Sig Start Date End Date Taking? Authorizing Provider  cetirizine (ZYRTEC ALLERGY) 10 MG tablet Take 1 tablet (10 mg total) by mouth daily. Patient not taking: Reported on 08/15/2014 08/25/13   Maude Leriche, PA-C  clindamycin (CLEOCIN) 150 MG capsule Take 2 capsules (300 mg total) by mouth 3 (three) times daily. 04/29/17   Kirichenko, Lahoma Rocker, PA-C  doxycycline (VIBRAMYCIN) 100 MG capsule Take 1 capsule (100 mg total) by mouth 2 (two) times daily. 04/29/17   Kirichenko, Lahoma Rocker, PA-C  ibuprofen (ADVIL,MOTRIN) 200 MG tablet Take 400 mg by mouth daily as needed for moderate pain (pain).    [provider]    ibuprofen (ADVIL,MOTRIN) 400 MG tablet Take 1 tablet (400 mg total) by mouth every 6 (six) hours as needed. 04/23/13   Elyn Peers, MD  ibuprofen (ADVIL,MOTRIN) 600 MG tablet Take 1 tablet (600 mg total) by mouth every 6 (six) hours as needed. 08/16/14   Dahlia Bailiff, PA-C  Naproxen Sodium (ALEVE PO) Take by mouth.    [provider]  naproxen sodium (ANAPROX) 220 MG tablet Take 660 mg by mouth daily as needed (pain).    [provider]  ondansetron (ZOFRAN) 4 MG tablet Take 1 tablet (4 mg total) by mouth every 6 (six) hours. Prn n/v. 08/21/14   Billy Fischer, MD  traMADol (ULTRAM) 50 MG tablet Take 1 tablet (50 mg total) by mouth every 6 (six) hours as needed. Patient not taking: Reported on 08/15/2014 04/23/13   Elyn Peers, MD    Family History No family history on file.  Social History Social History   Tobacco Use  . Smoking status: Never Smoker  Substance Use Topics  . Alcohol use: No  . Drug use: No    Comment: clean since 2011     Allergies   Patient has no known allergies.   Review of Systems Review of Systems  Skin: Positive for wound.  All other systems reviewed and are negative.    Physical Exam Updated Vital  Signs BP 114/74 (BP Location: Right Arm)   Pulse 73   Temp 98.4 F (36.9 C) (Oral)   Resp 16   SpO2 100%   Physical Exam  Constitutional: She appears well-developed and well-nourished. No distress.  Eyes: EOM are normal.  Neck: Neck supple.  Cardiovascular: Normal rate.  Pulmonary/Chest: Effort normal.  Musculoskeletal: Normal range of motion.  Neurological: She is alert.  Skin:  Packing in place right buttock. Area is draining.   Psychiatric: She has a normal mood and affect.  Nursing note and vitals reviewed.    ED Treatments / Results  Labs (all labs ordered are listed, but only abnormal results are displayed) Labs Reviewed - No data to display  Radiology No results found.  Procedures Procedures (including  critical care time) Packing removed from right buttock without difficulty. Area is draining purulent drainage.   Medications Ordered in ED Medications - No data to display   Initial Impression / Assessment and Plan / ED Course  I have reviewed the triage vital signs and the nursing notes. 41 y.o. female here for recheck of abscess and packing removal stable for d/c without fever and reports improvement of her symptoms. Return precautions discussed.   Final Clinical Impressions(s) / ED Diagnoses   Final diagnoses:  Wound check, abscess    ED Discharge Orders    None       Debroah Baller [place], [place] 05/01/17 2025    Virgel Manifold, MD 05/01/17 [phone]
